# Patient Record
Sex: Female | Born: 1995
Health system: Southern US, Community
[De-identification: ages and names within clinical notes are randomized; demographics above are authoritative.]

## PROBLEM LIST (undated history)

## (undated) ENCOUNTER — Inpatient Hospital Stay (HOSPITAL_COMMUNITY): Payer: Self-pay

## (undated) DIAGNOSIS — I10 Essential (primary) hypertension: Secondary | ICD-10-CM

## (undated) DIAGNOSIS — F1290 Cannabis use, unspecified, uncomplicated: Secondary | ICD-10-CM

## (undated) DIAGNOSIS — T7840XA Allergy, unspecified, initial encounter: Secondary | ICD-10-CM

## (undated) DIAGNOSIS — N3020 Other chronic cystitis without hematuria: Secondary | ICD-10-CM

## (undated) DIAGNOSIS — Z309 Encounter for contraceptive management, unspecified: Secondary | ICD-10-CM

## (undated) DIAGNOSIS — L732 Hidradenitis suppurativa: Secondary | ICD-10-CM

## (undated) DIAGNOSIS — N289 Disorder of kidney and ureter, unspecified: Secondary | ICD-10-CM

## (undated) DIAGNOSIS — R87629 Unspecified abnormal cytological findings in specimens from vagina: Secondary | ICD-10-CM

## (undated) DIAGNOSIS — R768 Other specified abnormal immunological findings in serum: Principal | ICD-10-CM

## (undated) DIAGNOSIS — Z3491 Encounter for supervision of normal pregnancy, unspecified, first trimester: Secondary | ICD-10-CM

## (undated) DIAGNOSIS — R87619 Unspecified abnormal cytological findings in specimens from cervix uteri: Secondary | ICD-10-CM

## (undated) DIAGNOSIS — K219 Gastro-esophageal reflux disease without esophagitis: Secondary | ICD-10-CM

## (undated) DIAGNOSIS — R609 Edema, unspecified: Secondary | ICD-10-CM

## (undated) HISTORY — DX: Unspecified abnormal cytological findings in specimens from cervix uteri: R87.619

## (undated) HISTORY — DX: Other chronic cystitis without hematuria: N30.20

## (undated) HISTORY — DX: Other specified abnormal immunological findings in serum: R76.8

## (undated) HISTORY — DX: Encounter for supervision of normal pregnancy, unspecified, first trimester: Z34.91

## (undated) HISTORY — PX: TONSILLECTOMY: SUR1361

## (undated) HISTORY — DX: Encounter for contraceptive management, unspecified: Z30.9

## (undated) HISTORY — DX: Unspecified abnormal cytological findings in specimens from vagina: R87.629

## (undated) HISTORY — DX: Hidradenitis suppurativa: L73.2

---
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
## 2013-04-09 ENCOUNTER — Ambulatory Visit (INDEPENDENT_AMBULATORY_CARE_PROVIDER_SITE_OTHER): Payer: Medicaid Other | Admitting: Adult Health

## 2013-04-09 ENCOUNTER — Encounter: Payer: Self-pay | Admitting: Adult Health

## 2013-04-09 ENCOUNTER — Encounter (INDEPENDENT_AMBULATORY_CARE_PROVIDER_SITE_OTHER): Payer: Self-pay

## 2013-04-09 VITALS — BP 100/58 | Ht 68.0 in | Wt 277.0 lb

## 2013-04-09 DIAGNOSIS — Z3200 Encounter for pregnancy test, result unknown: Secondary | ICD-10-CM

## 2013-04-09 DIAGNOSIS — Z3201 Encounter for pregnancy test, result positive: Secondary | ICD-10-CM

## 2013-04-15 ENCOUNTER — Other Ambulatory Visit: Payer: Self-pay | Admitting: Obstetrics & Gynecology

## 2013-04-15 DIAGNOSIS — O3680X0 Pregnancy with inconclusive fetal viability, not applicable or unspecified: Secondary | ICD-10-CM

## 2013-04-17 ENCOUNTER — Other Ambulatory Visit: Payer: Self-pay | Admitting: Obstetrics & Gynecology

## 2013-04-17 ENCOUNTER — Ambulatory Visit (INDEPENDENT_AMBULATORY_CARE_PROVIDER_SITE_OTHER): Payer: Medicaid Other

## 2013-04-17 ENCOUNTER — Encounter: Payer: Self-pay | Admitting: Obstetrics & Gynecology

## 2013-04-17 DIAGNOSIS — O3680X0 Pregnancy with inconclusive fetal viability, not applicable or unspecified: Secondary | ICD-10-CM

## 2013-04-17 NOTE — Progress Notes (Signed)
U/S(6+6wks)-transvaginal u/s performed, single IUP with +FCA noted, FHR-123 bpm, cx long and closed, bilateral adnexa WNL, CRL c/w LMP dates

## 2013-04-27 ENCOUNTER — Encounter (HOSPITAL_COMMUNITY): Payer: Self-pay | Admitting: Emergency Medicine

## 2013-04-27 ENCOUNTER — Emergency Department (HOSPITAL_COMMUNITY)
Admission: EM | Admit: 2013-04-27 | Discharge: 2013-04-27 | Disposition: A | Discharge status: Home / Self Care | Payer: Medicaid Other | Attending: Emergency Medicine | Admitting: Emergency Medicine

## 2013-04-27 DIAGNOSIS — O9989 Other specified diseases and conditions complicating pregnancy, childbirth and the puerperium: Secondary | ICD-10-CM | POA: Insufficient documentation

## 2013-04-27 DIAGNOSIS — K920 Hematemesis: Secondary | ICD-10-CM

## 2013-04-27 DIAGNOSIS — Z3491 Encounter for supervision of normal pregnancy, unspecified, first trimester: Secondary | ICD-10-CM

## 2013-04-27 DIAGNOSIS — Z87448 Personal history of other diseases of urinary system: Secondary | ICD-10-CM | POA: Insufficient documentation

## 2013-04-27 DIAGNOSIS — Z87891 Personal history of nicotine dependence: Secondary | ICD-10-CM | POA: Insufficient documentation

## 2013-04-27 DIAGNOSIS — Z79899 Other long term (current) drug therapy: Secondary | ICD-10-CM | POA: Insufficient documentation

## 2013-04-27 DIAGNOSIS — R1110 Vomiting, unspecified: Secondary | ICD-10-CM

## 2013-04-27 DIAGNOSIS — O210 Mild hyperemesis gravidarum: Secondary | ICD-10-CM | POA: Insufficient documentation

## 2013-04-27 LAB — CBC WITH DIFFERENTIAL/PLATELET
Basophils Absolute: 0 10*3/uL (ref 0.0–0.1)
Basophils Relative: 0 % (ref 0–1)
Eosinophils Absolute: 0 10*3/uL (ref 0.0–0.7)
Eosinophils Relative: 0 % (ref 0–5)
HCT: 38.5 % (ref 36.0–46.0)
Hemoglobin: 13.3 g/dL (ref 12.0–15.0)
Lymphocytes Relative: 17 % (ref 12–46)
Lymphs Abs: 2.4 10*3/uL (ref 0.7–4.0)
MCH: 29.9 pg (ref 26.0–34.0)
MCHC: 34.5 g/dL (ref 30.0–36.0)
MCV: 86.5 fL (ref 78.0–100.0)
Monocytes Absolute: 0.6 10*3/uL (ref 0.1–1.0)
Monocytes Relative: 4 % (ref 3–12)
Neutro Abs: 11 10*3/uL — ABNORMAL HIGH (ref 1.7–7.7)
Neutrophils Relative %: 79 % — ABNORMAL HIGH (ref 43–77)
Platelets: 300 10*3/uL (ref 150–400)
RBC: 4.45 MIL/uL (ref 3.87–5.11)
RDW: 12.6 % (ref 11.5–15.5)
WBC: 14 10*3/uL — ABNORMAL HIGH (ref 4.0–10.5)

## 2013-04-27 LAB — BASIC METABOLIC PANEL
BUN: 9 mg/dL (ref 6–23)
CO2: 26 mEq/L (ref 19–32)
Calcium: 9.1 mg/dL (ref 8.4–10.5)
Chloride: 102 mEq/L (ref 96–112)
Creatinine, Ser: 0.71 mg/dL (ref 0.50–1.10)
GFR calc Af Amer: >90 mL/min (ref >90)
GFR calc non Af Amer: >90 mL/min (ref >90)
Glucose, Bld: 116 mg/dL — ABNORMAL HIGH (ref 70–99)
Potassium: 3.8 mEq/L (ref 3.5–5.1)
Sodium: 136 mEq/L (ref 135–145)

## 2013-04-27 LAB — URINALYSIS, ROUTINE W REFLEX MICROSCOPIC
Bilirubin Urine: NEGATIVE
Glucose, UA: NEGATIVE mg/dL
Hgb urine dipstick: NEGATIVE
Ketones, ur: NEGATIVE mg/dL
Nitrite: NEGATIVE
Protein, ur: NEGATIVE mg/dL
Specific Gravity, Urine: >1.03 — ABNORMAL HIGH (ref 1.005–1.030)
Urobilinogen, UA: 0.2 mg/dL (ref 0.0–1.0)
pH: 5.5 (ref 5.0–8.0)

## 2013-04-27 LAB — URINE MICROSCOPIC-ADD ON

## 2013-04-27 LAB — POCT PREGNANCY, URINE: Preg Test, Ur: POSITIVE — AB

## 2013-04-27 MED ORDER — ONDANSETRON HCL 4 MG PO TABS: 4.0000 mg | ORAL_TABLET | Freq: Four times a day (QID) | ORAL | Status: DC

## 2013-04-27 MED ORDER — ONDANSETRON 4 MG PO TBDP
4.0000 mg | ORAL_TABLET | Freq: Once | ORAL | Status: AC
  Administered 2013-04-27: 4 mg via ORAL
  Filled 2013-04-27: qty 1

## 2013-04-27 NOTE — ED Notes (Signed)
Patient c/o lower abd pain. Per patient vomited x3 with bright red blood. Per patient blood increasing each time she vomited. Denies any diarrhea, fevers, or urinary symptoms. Per patient BM this morning with no blood noted.

## 2013-04-27 NOTE — ED Notes (Signed)
Pt alert & oriented x4, stable gait. Patient given discharge instructions, paperwork & prescription(s). Patient  instructed to stop at the registration desk to finish any additional paperwork. Patient verbalized understanding. Pt left department w/ no further questions. 

## 2013-04-29 ENCOUNTER — Telehealth: Payer: Self-pay

## 2013-04-29 NOTE — Telephone Encounter (Signed)
Left message x 1. JSY 

## 2013-05-01 NOTE — Telephone Encounter (Signed)
Left message x 2. JSY 

## 2013-05-01 NOTE — ED Provider Notes (Signed)
CSN: [id number]     Arrival date & time 04/27/13  1239 History   First MD Initiated Contact with Patient 04/27/13 1306     Chief Complaint  Patient presents with  . Hematemesis  . Abdominal Pain   (Consider location/radiation/quality/duration/timing/severity/associated sxs/prior Treatment) HPI  17yf with n/v. Onset today. 3 episodes. Food she ate earlier with streaks of BRB. Denies abdominal, chest or neck pain. No fever or chills. Pregnant. No urinary complaints. No vaginal bleeding or discharge. No sick contacts. No diarrhea. No intervention prior to arrival. No appreciable exacerbating or relieving factors.    Past Medical History  Diagnosis Date  . Renal disorder   . Swelling    Past Surgical History  Procedure Laterality Date  . Tonsillectomy     Family History  Problem Relation Age of Onset  . Diabetes Paternal Grandfather   . Heart disease Paternal Grandfather   . Diabetes Paternal Grandmother   . Heart disease Paternal Grandmother   . Diabetes Maternal Grandmother   . Heart disease Maternal Grandmother   . Diabetes Maternal Grandfather   . Heart disease Maternal Grandfather    History  Substance Use Topics  . Smoking status: Former Smoker -- 0.50 packs/day for 2 years    Types: Cigarettes    Quit date: 04/04/2013  . Smokeless tobacco: Former Neurosurgeon    Types: Chew  . Alcohol Use: No   OB History   Grav Para Term Preterm Abortions TAB SAB Ect Mult Living   1              Review of Systems  All systems reviewed and negative, other than as noted in HPI.   Allergies  Aleve; Amoxicillin; and Tylenol  Home Medications   Current Outpatient Rx  Name  Route  Sig  Dispense  Refill  . calcium carbonate (TUMS - DOSED IN MG ELEMENTAL CALCIUM) 500 MG chewable tablet   Oral   Chew 1 tablet by mouth daily as needed for indigestion or heartburn.         . diphenhydrAMINE (BENADRYL) 25 mg capsule   Oral   Take 50 mg by mouth every 6 (six) hours as needed.  Allergies         . EPINEPHrine (EPI-PEN) 0.3 mg/0.3 mL DEVI   Intramuscular   Inject 0.3 mLs (0.3 mg total) into the muscle once.   1 Device   1   . flintstones complete (FLINTSTONES) 60 MG chewable tablet   Oral   Chew 1 tablet by mouth daily.         . ondansetron (ZOFRAN) 4 MG tablet   Oral   Take 1 tablet (4 mg total) by mouth every 6 (six) hours.   30 tablet   0    BP 123/56  Pulse 91  Temp(Src) 97.8 F (36.6 C) (Oral)  Resp 18  Ht 5\' 8"  (1.727 m)  Wt 277 lb 12.8 oz (126.009 kg)  BMI 42.25 kg/m2  SpO2 100%  LMP 02/28/2013 Physical Exam  Nursing note and vitals reviewed. Constitutional: She appears well-developed and well-nourished. No distress.  HENT:  Head: Normocephalic and atraumatic.  Eyes: Conjunctivae are normal. Right eye exhibits no discharge. Left eye exhibits no discharge.  Neck: Neck supple.  Cardiovascular: Normal rate, regular rhythm and normal heart sounds.  Exam reveals no gallop and no friction rub.   No murmur heard. Pulmonary/Chest: Effort normal and breath sounds normal. No respiratory distress.  Abdominal: Soft. She exhibits no distension. There is no  tenderness.  Genitourinary:  No cva tenderness  Musculoskeletal: She exhibits no edema and no tenderness.  Neurological: She is alert.  Skin: Skin is warm and dry.  Psychiatric: She has a normal mood and affect. Her behavior is normal. Thought content normal.    ED Course  Procedures (including critical care time) Labs Review Labs Reviewed  URINALYSIS, ROUTINE W REFLEX MICROSCOPIC - Abnormal; Notable for the following:    Color, Urine AMBER (*)    APPearance CLOUDY (*)    Specific Gravity, Urine >1.030 (*)    Leukocytes, UA TRACE (*)    All other components within normal limits  CBC WITH DIFFERENTIAL - Abnormal; Notable for the following:    WBC 14.0 (*)    Neutrophils Relative % 79 (*)    Neutro Abs 11.0 (*)    All other components within normal limits  BASIC METABOLIC PANEL -  Abnormal; Notable for the following:    Glucose, Bld 116 (*)    All other components within normal limits  URINE MICROSCOPIC-ADD ON - Abnormal; Notable for the following:    Squamous Epithelial / LPF FEW (*)    Bacteria, UA FEW (*)    All other components within normal limits  POCT PREGNANCY, URINE - Abnormal; Notable for the following:    Preg Test, Ur POSITIVE (*)    All other components within normal limits   Imaging Review No results found.  EKG Interpretation   None       MDM   1. Vomiting   2. Hematemesis   3. First trimester pregnancy    17 year old female with nausea and vomiting. Pregnant. She has benign abdominal exam. Minimal hematemesis. Suspect small mallory weiss tear.  Prenatal vitamins. Outpatient OB FU.     Raeford Razor, MD 05/06/13 [phone]

## 2013-05-03 ENCOUNTER — Ambulatory Visit (INDEPENDENT_AMBULATORY_CARE_PROVIDER_SITE_OTHER): Payer: Medicaid Other | Admitting: Adult Health

## 2013-05-03 ENCOUNTER — Encounter: Payer: Self-pay | Admitting: Adult Health

## 2013-05-03 VITALS — BP 140/60 | Wt 282.0 lb

## 2013-05-03 DIAGNOSIS — Z1389 Encounter for screening for other disorder: Secondary | ICD-10-CM

## 2013-05-03 DIAGNOSIS — Z3401 Encounter for supervision of normal first pregnancy, first trimester: Secondary | ICD-10-CM

## 2013-05-03 DIAGNOSIS — Z3491 Encounter for supervision of normal pregnancy, unspecified, first trimester: Secondary | ICD-10-CM

## 2013-05-03 DIAGNOSIS — Z331 Pregnant state, incidental: Secondary | ICD-10-CM

## 2013-05-03 DIAGNOSIS — Z3400 Encounter for supervision of normal first pregnancy, unspecified trimester: Secondary | ICD-10-CM | POA: Insufficient documentation

## 2013-05-03 DIAGNOSIS — E669 Obesity, unspecified: Secondary | ICD-10-CM

## 2013-05-03 HISTORY — DX: Encounter for supervision of normal pregnancy, unspecified, first trimester: Z34.91

## 2013-05-03 LAB — POCT URINALYSIS DIPSTICK
Glucose, UA: NEGATIVE
Ketones, UA: NEGATIVE
Nitrite, UA: NEGATIVE

## 2013-05-03 LAB — CBC
Hemoglobin: 13.8 g/dL (ref 12.0–15.0)
MCH: 29.4 pg (ref 26.0–34.0)
Platelets: 313 10*3/uL (ref 150–400)
RBC: 4.7 MIL/uL (ref 3.87–5.11)
WBC: 13.2 10*3/uL — ABNORMAL HIGH (ref 4.0–10.5)

## 2013-05-03 LAB — HIV ANTIBODY (ROUTINE TESTING W REFLEX): HIV: NONREACTIVE

## 2013-05-03 LAB — RPR

## 2013-05-03 NOTE — Patient Instructions (Signed)

## 2013-05-03 NOTE — Progress Notes (Signed)
  Subjective:    Suzanne Short is a 17 y.o. G1P0 Caucasian female at [redacted]w[redacted]d by LMP being seen today for her first obstetrical visit.  Her obstetrical history is significant for obesity.  Pregnancy history fully reviewed.   Patient reports nausea. Denies vb, cramping, uti s/s, abnormal/malodorous vag d/c, or vulvovaginal itching/irritation.  Filed Vitals:   05/03/13 1050  BP: 140/60  Weight: 282 lb (127.914 kg)    HISTORY: OB History  Gravida Para Term Preterm AB SAB TAB Ectopic Multiple Living  1             # Outcome Date GA Lbr Len/2nd Weight Sex Delivery Anes PTL Lv  1 CUR              Past Medical History  Diagnosis Date  . Renal disorder   . Swelling   . Bladder infection, chronic   . Supervision of normal pregnancy in first trimester 05/03/2013   Past Surgical History  Procedure Laterality Date  . Tonsillectomy     Family History  Problem Relation Age of Onset  . Diabetes Paternal Grandfather   . Heart disease Paternal Grandfather   . Diabetes Paternal Grandmother   . Heart disease Paternal Grandmother   . Diabetes Maternal Grandmother   . Heart disease Maternal Grandmother   . Diabetes Maternal Grandfather   . Heart disease Maternal Grandfather   . Heart disease Mother      Exam    Pelvic Exam:    Perineum: defereed   Vulva: deferred   Vagina:  deferred   Uterus 9 weeks     Cervix: deferred   Adnexa: Not palpable   Urinary: deferred    System:     Skin: normal coloration and turgor, no rashes    Neurologic: oriented, normal mood   Extremities: normal strength, tone, and muscle mass   HEENT PERRLA,thyroid normal   Mouth/Teeth mucous membranes moist   Cardiovascular: regular rate and rhythm   Respiratory:  appears well, vitals normal, no respiratory distress, acyanotic, normal RR   Abdomen: soft, non-tender     FHR: 178   Assessment:    Pregnancy: G1P0 Patient Active Problem List   Diagnosis Date Noted  . Supervision of normal pregnancy  in first trimester 05/03/2013      [redacted]w[redacted]d G1P0 New OB visit    Plan:     Initial labs drawn Continue prenatal vitamins Problem list reviewed and updated Reviewed n/v relief measures and warning s/s to report Reviewed recommended weight gain based on pre-gravid BMI Encouraged well-balanced diet Genetic Screening discussed Integrated Screen: requested Cystic fibrosis screening discussed requested Ultrasound discussed; fetal survey: requested Follow up in 3 weeks for IT/NT and see me Get flu shot at clinic  GRIFFIN,JENNIFER 05/03/2013 11:20 AM

## 2013-05-04 LAB — URINALYSIS, ROUTINE W REFLEX MICROSCOPIC
Bilirubin Urine: NEGATIVE
Glucose, UA: NEGATIVE mg/dL
Hgb urine dipstick: NEGATIVE
Ketones, ur: NEGATIVE mg/dL
Protein, ur: NEGATIVE mg/dL
pH: 7.5 (ref 5.0–8.0)

## 2013-05-04 LAB — URINALYSIS, MICROSCOPIC ONLY
Bacteria, UA: NONE SEEN
Casts: NONE SEEN
Crystals: NONE SEEN
Squamous Epithelial / LPF: NONE SEEN

## 2013-05-04 LAB — ABO AND RH: Rh Type: POSITIVE

## 2013-05-04 LAB — GC/CHLAMYDIA PROBE AMP
CT Probe RNA: NEGATIVE
GC Probe RNA: NEGATIVE

## 2013-05-04 LAB — DRUG SCREEN, URINE, NO CONFIRMATION
Barbiturate Quant, Ur: NEGATIVE
Creatinine,U: 114.8 mg/dL
Marijuana Metabolite: POSITIVE — AB
Methadone: NEGATIVE
Opiate Screen, Urine: NEGATIVE
Phencyclidine (PCP): NEGATIVE
Propoxyphene: NEGATIVE

## 2013-05-05 LAB — URINE CULTURE

## 2013-05-13 NOTE — Telephone Encounter (Signed)
Left message x 3. Encounter closed. JSY 

## 2013-05-16 NOTE — L&D Delivery Note (Signed)
`````  Attestation of Attending Supervision of Advanced Practitioner: Evaluation and management procedures were performed by the PA/NP/CNM/OB Fellow under my supervision/collaboration. Chart reviewed and agree with management and plan.  Jeorgia Helming V 12/14/2013 3:33 PM    

## 2013-05-16 NOTE — L&D Delivery Note (Signed)
Delivery Note  After about 3 hours of pushing, at  a viable female was delivered via  (Presentation:  LOA ).  APGAR:5/8  ; weight 9# 12 oz. The shoulders were not forthcoming, so the posterior (left) axilla was grasped with my index finger, and the baby was rotated clockwise into the oblique diameter.  At this point, the (now) anterior shoulder was released, and the baby delivered.  At no time was any traction placed on the baby's head.This maneuver was performed by CNM, and once the shoulder was released, the delivery was handed back to Dr. Andria MeuseStevens.  40 units of pitocin diluted in 1000cc LR was infused rapidly IV.  The placenta separated spontaneously and delivered via CCT and maternal pushing effort.  It was inspected and appears to be intact with a 3 VC. Cord gas drawn but clotted.  There were the following complications: mild shoulder dystocia  Anesthesia: Epidural and local Episiotomy: none Lacerations: 2nd degree perineal Suture Repair: 2.0 vicryl Est. Blood Loss (mL): 300  Mom to postpartum.  Baby to Couplet care / Skin to Skin.  Delivery and repair performed by Dr. Andria MeuseStevens under my supervision.

## 2013-05-22 ENCOUNTER — Other Ambulatory Visit: Payer: Self-pay | Admitting: Obstetrics & Gynecology

## 2013-05-22 DIAGNOSIS — Z36 Encounter for antenatal screening of mother: Secondary | ICD-10-CM

## 2013-05-27 ENCOUNTER — Ambulatory Visit (INDEPENDENT_AMBULATORY_CARE_PROVIDER_SITE_OTHER): Payer: Medicaid Other | Admitting: Obstetrics & Gynecology

## 2013-05-27 ENCOUNTER — Ambulatory Visit (INDEPENDENT_AMBULATORY_CARE_PROVIDER_SITE_OTHER): Payer: Medicaid Other

## 2013-05-27 ENCOUNTER — Other Ambulatory Visit: Payer: Self-pay | Admitting: Obstetrics & Gynecology

## 2013-05-27 ENCOUNTER — Encounter (INDEPENDENT_AMBULATORY_CARE_PROVIDER_SITE_OTHER): Payer: Self-pay

## 2013-05-27 ENCOUNTER — Encounter: Payer: Self-pay | Admitting: Obstetrics & Gynecology

## 2013-05-27 VITALS — BP 120/60 | Wt 280.0 lb

## 2013-05-27 DIAGNOSIS — E669 Obesity, unspecified: Secondary | ICD-10-CM

## 2013-05-27 DIAGNOSIS — O99210 Obesity complicating pregnancy, unspecified trimester: Secondary | ICD-10-CM

## 2013-05-27 DIAGNOSIS — Z331 Pregnant state, incidental: Secondary | ICD-10-CM

## 2013-05-27 DIAGNOSIS — Z36 Encounter for antenatal screening of mother: Secondary | ICD-10-CM

## 2013-05-27 DIAGNOSIS — Z1389 Encounter for screening for other disorder: Secondary | ICD-10-CM

## 2013-05-27 LAB — POCT URINALYSIS DIPSTICK
GLUCOSE UA: NEGATIVE
KETONES UA: NEGATIVE
Leukocytes, UA: NEGATIVE
Nitrite, UA: NEGATIVE
RBC UA: NEGATIVE

## 2013-05-27 NOTE — Progress Notes (Signed)
No bleeding No NV Feels pretty good

## 2013-05-27 NOTE — Progress Notes (Signed)
U/S(12+4wks)-single IUP with +FCA noted, FHR-168 bpm, CRL c/w dates, cx appears long and closed, bilateral adnexa appears wnl, anterior Gr 0 placenta noted, NT-1.1652mm, NB-present

## 2013-05-30 LAB — MATERNAL SCREEN, INTEGRATED #1

## 2013-06-03 ENCOUNTER — Other Ambulatory Visit: Payer: Self-pay | Admitting: *Deleted

## 2013-06-03 MED ORDER — ONDANSETRON HCL 4 MG PO TABS: 4.0000 mg | ORAL_TABLET | Freq: Four times a day (QID) | ORAL | Status: DC

## 2013-06-06 ENCOUNTER — Emergency Department (HOSPITAL_COMMUNITY)
Admission: EM | Admit: 2013-06-06 | Discharge: 2013-06-06 | Disposition: A | Discharge status: Home / Self Care | Payer: Medicaid Other | Attending: Emergency Medicine | Admitting: Emergency Medicine

## 2013-06-06 ENCOUNTER — Encounter (HOSPITAL_COMMUNITY): Payer: Self-pay | Admitting: Emergency Medicine

## 2013-06-06 DIAGNOSIS — Z79899 Other long term (current) drug therapy: Secondary | ICD-10-CM | POA: Insufficient documentation

## 2013-06-06 DIAGNOSIS — N390 Urinary tract infection, site not specified: Secondary | ICD-10-CM | POA: Insufficient documentation

## 2013-06-06 DIAGNOSIS — Z880 Allergy status to penicillin: Secondary | ICD-10-CM | POA: Insufficient documentation

## 2013-06-06 DIAGNOSIS — R251 Tremor, unspecified: Secondary | ICD-10-CM

## 2013-06-06 DIAGNOSIS — R259 Unspecified abnormal involuntary movements: Secondary | ICD-10-CM | POA: Insufficient documentation

## 2013-06-06 DIAGNOSIS — O9989 Other specified diseases and conditions complicating pregnancy, childbirth and the puerperium: Secondary | ICD-10-CM | POA: Insufficient documentation

## 2013-06-06 DIAGNOSIS — Z87891 Personal history of nicotine dependence: Secondary | ICD-10-CM | POA: Insufficient documentation

## 2013-06-06 DIAGNOSIS — O2390 Unspecified genitourinary tract infection in pregnancy, unspecified trimester: Secondary | ICD-10-CM | POA: Insufficient documentation

## 2013-06-06 LAB — URINALYSIS W MICROSCOPIC + REFLEX CULTURE
Bilirubin Urine: NEGATIVE
GLUCOSE, UA: NEGATIVE mg/dL
Hgb urine dipstick: NEGATIVE
KETONES UR: NEGATIVE mg/dL
Nitrite: NEGATIVE
PROTEIN: NEGATIVE mg/dL
Specific Gravity, Urine: 1.025 (ref 1.005–1.030)
UROBILINOGEN UA: 0.2 mg/dL (ref 0.0–1.0)
pH: 6 (ref 5.0–8.0)

## 2013-06-06 LAB — POCT I-STAT, CHEM 8
Calcium, Ion: 1.17 mmol/L (ref 1.12–1.23)
Chloride: 101 mEq/L (ref 96–112)
Creatinine, Ser: 0.6 mg/dL (ref 0.50–1.10)
Glucose, Bld: 94 mg/dL (ref 70–99)
HCT: 40 % (ref 36.0–46.0)
Hemoglobin: 13.6 g/dL (ref 12.0–15.0)
POTASSIUM: 3.6 meq/L — AB (ref 3.7–5.3)
SODIUM: 135 meq/L — AB (ref 137–147)
TCO2: 20 mmol/L (ref 0–100)

## 2013-06-06 LAB — GLUCOSE, CAPILLARY: GLUCOSE-CAPILLARY: 96 mg/dL (ref 70–99)

## 2013-06-06 MED ORDER — NITROFURANTOIN MONOHYD MACRO 100 MG PO CAPS: 100.0000 mg | ORAL_CAPSULE | Freq: Two times a day (BID) | ORAL | Status: DC

## 2013-06-06 NOTE — ED Notes (Signed)
Pt given detalied discharge instructions, 1 script given, verbalized understanding of all

## 2013-06-06 NOTE — ED Notes (Signed)
Woke up, sit on side of the bed, felt like her sugar dropped, blurred vision, shaking, diaphoretic, pt drank air juice and ate crackers and them vomiting. She did get some apple juice to stay down. Father brought to ED. CBG 96 at this time

## 2013-06-06 NOTE — ED Notes (Signed)
Pt is [redacted] weeks pregnant, states she felt fine when she went to bed.

## 2013-06-06 NOTE — ED Provider Notes (Signed)
CSN: [id number]     Arrival date & time 06/06/13  [id number] History   First MD Initiated Contact with Patient 06/06/13 0700     Chief Complaint  Patient presents with  . Shaking    HPI Pt was seen at 0710. Per pt, c/o sudden onset and resolution of one episode of "feeling shaky all over" that began PTA. Pt states she woke up from sleep feeling "shaky" with hot/cold chills. States she was able to walk to her kitchen, ate crackers, and had one episode of N/V. Then drank juice without N/V. States her symptoms fully resolved at that time and remain improved. Denies any other complaints. Denies hx of DM. Hx G1P0, LMP 02/28/13, with EGA [redacted] weeks. Denies dysuria/hematuria, no vaginal bleeding/discharge, no abd/pelvic pain, no diarrhea, no black or blood in stools or emesis, no fevers, no rash, no CP/palpitations, no SOB/cough, no sore throat.    OB/GYN: Dr. Despina HiddenEure Past Medical History  Diagnosis Date  . Renal disorder   . Swelling   . Bladder infection, chronic   . Supervision of normal pregnancy in first trimester 05/03/2013   Past Surgical History  Procedure Laterality Date  . Tonsillectomy     Family History  Problem Relation Age of Onset  . Diabetes Paternal Grandfather   . Heart disease Paternal Grandfather   . Diabetes Paternal Grandmother   . Heart disease Paternal Grandmother   . Diabetes Maternal Grandmother   . Heart disease Maternal Grandmother   . Diabetes Maternal Grandfather   . Heart disease Maternal Grandfather   . Heart disease Mother    History  Substance Use Topics  . Smoking status: Former Smoker -- 0.50 packs/day for 2 years    Types: Cigarettes    Quit date: 04/04/2013  . Smokeless tobacco: Former NeurosurgeonUser    Types: Chew  . Alcohol Use: No   OB History   Grav Para Term Preterm Abortions TAB SAB Ect Mult Living   1              Review of Systems ROS: Statement: All systems negative except as marked or noted in the HPI; Constitutional: Negative for fever and  +hot/cold chills, shaky. ; ; Eyes: Negative for eye pain, redness and discharge. ; ; ENMT: Negative for ear pain, hoarseness, nasal congestion, sinus pressure and sore throat. ; ; Cardiovascular: Negative for chest pain, palpitations, diaphoresis, dyspnea and peripheral edema. ; ; Respiratory: Negative for cough, wheezing and stridor. ; ; Gastrointestinal: +N/V x1. Negative for diarrhea, abdominal pain, blood in stool, hematemesis, jaundice and rectal bleeding. . ; ; Genitourinary: Negative for dysuria, flank pain and hematuria. ; ; Musculoskeletal: Negative for back pain and neck pain. Negative for swelling and trauma.; ; Skin: Negative for pruritus, rash, abrasions, blisters, bruising and skin lesion.; ; Neuro: Negative for headache, lightheadedness and neck stiffness. Negative for weakness, altered level of consciousness , altered mental status, extremity weakness, paresthesias, involuntary movement, seizure and syncope.      Allergies  Aleve; Amoxicillin; and Tylenol  Home Medications   Current Outpatient Rx  Name  Route  Sig  Dispense  Refill  . calcium carbonate (TUMS - DOSED IN MG ELEMENTAL CALCIUM) 500 MG chewable tablet   Oral   Chew 1 tablet by mouth daily as needed for indigestion or heartburn.         . flintstones complete (FLINTSTONES) 60 MG chewable tablet   Oral   Chew 1 tablet by mouth daily.         .Marland Kitchen  ondansetron (ZOFRAN) 4 MG tablet   Oral   Take 1 tablet (4 mg total) by mouth every 6 (six) hours.   30 tablet   4   . diphenhydrAMINE (BENADRYL) 25 mg capsule   Oral   Take 50 mg by mouth every 6 (six) hours as needed. Allergies         . EPINEPHrine (EPI-PEN) 0.3 mg/0.3 mL DEVI   Intramuscular   Inject 0.3 mLs (0.3 mg total) into the muscle once.   1 Device   1    BP 123/73  Pulse 107  Temp(Src) 98.6 F (37 C) (Oral)  Resp 20  Ht 5\' 10"  (1.778 m)  Wt 279 lb (126.554 kg)  BMI 40.03 kg/m2  SpO2 100%  LMP 02/28/2013 Physical Exam 0715: Physical  examination:  Nursing notes reviewed; Vital signs and O2 SAT reviewed;  Constitutional: Well developed, Well nourished, Well hydrated, In no acute distress; Head:  Normocephalic, atraumatic; Eyes: EOMI, PERRL, No scleral icterus; ENMT: TM's clear bilat. +edemetous nasal turbinates bilat with clear rhinorrhea. Mouth and pharynx without lesions. No tonsillar exudates. No intra-oral edema. No submandibular or sublingual edema. No hoarse voice, no drooling, no stridor. No pain with manipulation of larynx. Mouth and pharynx normal, Mucous membranes moist; Neck: Supple, Full range of motion, No lymphadenopathy; Cardiovascular: Regular rate and rhythm, No gallop; Respiratory: Breath sounds clear & equal bilaterally, No wheezes.  Speaking full sentences with ease, Normal respiratory effort/excursion; Chest: Nontender, Movement normal; Abdomen: Soft, Nontender, Nondistended, Normal bowel sounds; Genitourinary: No CVA tenderness; Extremities: Pulses normal, No tenderness, No edema, No calf edema or asymmetry.; Neuro: AA&Ox3, Major CN grossly intact. No facial droop. Speech clear. No gross focal motor or sensory deficits in extremities. Climbs on and off stretcher easily by herself. Gait steady.; Skin: Color normal, Warm, Dry.   ED Course  Procedures    EKG Interpretation   None       MDM  MDM Reviewed: previous chart, nursing note and vitals Reviewed previous: labs Interpretation: labs   Results for orders placed during the hospital encounter of 06/06/13  GLUCOSE, CAPILLARY      Result Value Range   Glucose-Capillary 96  70 - 99 mg/dL   Comment 1 Documented in Chart     Comment 2 Notify RN    URINALYSIS W MICROSCOPIC + REFLEX CULTURE      Result Value Range   Color, Urine YELLOW  YELLOW   APPearance CLEAR  CLEAR   Specific Gravity, Urine 1.025  1.005 - 1.030   pH 6.0  5.0 - 8.0   Glucose, UA NEGATIVE  NEGATIVE mg/dL   Hgb urine dipstick NEGATIVE  NEGATIVE   Bilirubin Urine NEGATIVE  NEGATIVE    Ketones, ur NEGATIVE  NEGATIVE mg/dL   Protein, ur NEGATIVE  NEGATIVE mg/dL   Urobilinogen, UA 0.2  0.0 - 1.0 mg/dL   Nitrite NEGATIVE  NEGATIVE   Leukocytes, UA SMALL (*) NEGATIVE   WBC, UA TOO NUMEROUS TO COUNT  <3 WBC/hpf   Bacteria, UA MANY (*) RARE   Squamous Epithelial / LPF MANY (*) RARE  POCT I-STAT, CHEM 8      Result Value Range   Sodium 135 (*) 137 - 147 mEq/L   Potassium 3.6 (*) 3.7 - 5.3 mEq/L   Chloride 101  96 - 112 mEq/L   BUN <3 (*) 6 - 23 mg/dL   Creatinine, Ser 1.61  0.50 - 1.10 mg/dL   Glucose, Bld 94  70 - 99 mg/dL  Calcium, Ion 1.17  1.12 - 1.23 mmol/L   TCO2 20  0 - 100 mmol/L   Hemoglobin 13.6  12.0 - 15.0 g/dL   HCT 45.4  09.8 - 11.9 %   US Fetal Nuchal Translucency Measurement 05/27/2013   NUCHAL TRANSLUCENCY FOR INTEGRATED TESTING   Suzanne Short is in the office for nuchal translucency sonogram as part of  an integrated screen.  She is a 18 y.o. year old G1P0 with Estimated Date of Delivery: 12/05/13 by  LMP now at  [redacted]w[redacted]d weeks gestation. Thus far the pregnancy has been  complicated by +UDS.  GESTATION: SINGLETON  FETAL ACTIVITY:          Heart rate         168 bpm          The fetus is active.  AMNIOTIC FLUID: The amniotic fluid volume is  normal,   PLACENTA LOCALIZATION:  anterior GRADE 0  CERVIX: Appears long and closed   ADNEXA: The ovaries are normal.      GESTATIONAL AGE AND  BIOMETRICS:  Gestational criteria: Estimated Date of Delivery: 12/05/13 by LMP now at  [redacted]w[redacted]d  Previous Scans:1  GESTATIONAL SAC            mm          weeks  CROWN RUMP LENGTH           67.2 mm         13+0 weeks  NUCHAL TRANSLUCENCY           1.52 mm         normal                                                                           AVERAGE EGA(BY THIS SCAN):   13+0 weeks   The fetal nasal bone is identified.    TECHNICIAN COMMENTS:  U/S(12+4wks)-single IUP with +FCA noted, FHR-168 bpm, CRL c/w dates, cx  appears long and closed, bilateral adnexa appears wnl, anterior Gr 0  placenta  noted, NT-1.51mm, NB-present   The patient will have the first blood draw of her integrated screening  today and the second draw in approximately 4 weeks.  Chari Manning 05/27/2013 10:51 AM  Clinical Impression and recommendations:  I have reviewed the sonogram results above, combined with the patient's  current clinical course, below are my impressions and any appropriate  recommendations for management based on the sonographic findings.  1.  G1P0 Estimated Date of Delivery: 12/05/13 by  LMP and confirmed by  today's sonographic dating 2.  Normal fetal sonographic findings, specifically normal nuchal  translucency and positive fetal nasal bone 3.  Normal general sonographic findings  Recommend routine prenatal care based on this sonogram or as clinically  indicated  EURE,LUTHER H 05/27/2013 11:33 AM                                                              0815:  Pt has tol PO well while in the  ED without N/V.  No stooling while in the ED.  Abd remains benign, VSS. Has been ambulatory around the ED with steady upright gait, resps easy, NAD. Continues to feel better and wants to go home now. Will tx for UTI while UC pending. Dx and testing d/w pt.  Questions answered.  Verb understanding, agreeable to d/c home with outpt f/u.      Laray Anger, DO 06/09/13 1129

## 2013-06-06 NOTE — Discharge Instructions (Signed)
°Emergency Department Resource Guide °1) Find a Doctor and Pay Out of Pocket °Although you won't have to find out who is covered by your insurance plan, it is a good idea to ask around and get recommendations. You will then need to call the office and see if the doctor you have chosen will accept you as a new patient and what types of options they offer for patients who are self-pay. Some doctors offer discounts or will set up payment plans for their patients who do not have insurance, but you will need to ask so you aren't surprised when you get to your appointment. ° °2) Contact Your Local Health Department °Not all health departments have doctors that can see patients for sick visits, but many do, so it is worth a call to see if yours does. If you don't know where your local health department is, you can check in your phone book. The CDC also has a tool to help you locate your state's health department, and many state websites also have listings of all of their local health departments. ° °3) Find a Walk-in Clinic °If your illness is not likely to be very severe or complicated, you may want to try a walk in clinic. These are popping up all over the country in pharmacies, drugstores, and shopping centers. They're usually staffed by nurse practitioners or physician assistants that have been trained to treat common illnesses and complaints. They're usually fairly quick and inexpensive. However, if you have serious medical issues or chronic medical problems, these are probably not your best option. ° °No Primary Care Doctor: °- Call Health Connect at  832-8000 - they can help you locate a primary care doctor that  accepts your insurance, provides certain services, etc. °- Physician Referral Service- 1-800-533-3463 ° °Chronic Pain Problems: °Organization         Address  Phone   Notes  °[facility]  ([phone]) [phone] Patients need to be referred by their primary care doctor.  ° °Medication  Assistance: °Organization         Address  Phone   Notes  °[place] [facility] [address] °[place], [place] [address] (336) 641-8030 --Must be a resident of [place] °-- Must have NO insurance coverage whatsoever (no Medicaid/ Medicare, etc.) °-- The pt. MUST have a primary care doctor that directs their care regularly and follows them in the community °  °[facility]  (866) 331-1348   °[employer]  (888) 892-1162   ° °Agencies that provide inexpensive medical care: °Organization         Address  Phone   Notes  °[facility]  (336) 832-8035   °[facility]    (336) 832-7272   °[facility] [address] °[place], [place] [address] (336) 832-4777   °[facility] [address], °[place] (336) 271-4999   °[facility]    (336) 373-0678   °[facility]    (336) 272-1050   °[facility] ° [address], [place] Phone:  (336) 832-4444, Fax:  (336) 832-4440 Hours of Operation:  9 am - 6 pm, M-F.  Also accepts Medicaid/Medicare and self-pay.  °[facility] ° [address], [place] Phone: (336) [phone], Fax: (336) [phone]. Hours of Operation:  8:30 am - 5:30 pm, M-F.  Also accepts Medicaid and self-pay.  °[facility] [place] [address], [place] Phone: (336) 878-6027   °[facility] [address], [place], [place] (336)723-1848, Ext. 123 Mondays & Thursdays: 7-9 AM.  First 15 patients are seen on a first come, first serve basis. °  ° °Medicaid-accepting [place] Providers: ° °Organization         Address  Phone   Notes  °[facility] [address], [place] (336) 641-2100 Also accepts self-pay patients.  °[facility] [address], [place] ° (336) 856-9996   °[facility] [address], [place]  (336) 288-8857   °[facility] [address] [place] [address], [place] (336) 299-7000   °Veita Bland [address], [place]  ° (336) 373-1557 Only accepts [place] Access Medicaid patients after they have their name applied to their card.  ° °Self-Pay (no insurance) in [place]: ° °Organization         Address  Phone   Notes  °Sickle Cell Patients, [facility] [address], [place] (336) 832-1970   °[facility] [address], [place] (336) 832-4400   °[facility] ° [address], [place] (336) 992-4800   °[facility]/Dr. Osei-Bonsu ° [address] [place] [address], [place] or [address], [place] (336) [phone] Phone number for both [place] and [place] locations is the same.  °[facility] [address], [place] (336) 299-0000   °[facility] [address] [place] [address], [place] or [address] (336) 852-7530 °(336) 878-2260   °[facility] [address], [place] (336) 350-1642, phone; (336) 294-5005, fax Sees patients 1st and 3rd Saturday of every month.  Must not qualify for public or private insurance (i.e. Medicaid, Medicare, [place] Health Choice, Veterans' Benefits) • Household income should be no more than 200% of the poverty level •The clinic cannot treat you if you are pregnant or think you are pregnant • Sexually transmitted diseases are not treated at the clinic.  ° ° °Dental Care: °Organization         Address  Phone  Notes  °[place] [facility] [address], [place] (336) 641-6152 Accepts children up to age 21 who are enrolled in Medicaid or [place] Health Choice; pregnant women with a Medicaid card; and children who have applied for Medicaid or [place] Health Choice, but were declined, whose parents can pay a reduced fee at time of service.  °[place]  [facility] [place]  [address], [place] (336) 641-7733 Accepts children up to age 21 who are enrolled in Medicaid or [place] Health Choice; pregnant women with a Medicaid card; and children who have applied for Medicaid or [place] Health Choice, but were declined, whose parents can pay a reduced fee at time of service.  °[facility] ° [address], [place] (336) 641-4533 Patients are seen by appointment only. Walk-ins are not accepted. [facility] will see patients 18 years of age and older. °Monday - Tuesday (8am-5pm) °Most Wednesdays (8:30-5pm) °$30 per visit, cash only  °[facility] ° [address], [place] (336) 641-4533 Patients are seen by appointment only. Walk-ins are not accepted. [facility] will see patients 18 years of age and older. °One   Wednesday Evening (Monthly: Volunteer Based).  $30 per visit, cash only  °[facility]  (919) 537-3737 for adults; Children under age 4, call Graduate Pediatric Dentistry at ([phone]) [phone]. Children aged 4-14, please call (919) [phone] to request a pediatric application. ° Dental services are provided in all areas of dental care including fillings, crowns and bridges, complete and partial dentures, implants, gum treatment, root canals, and extractions. Preventive care is also provided. Treatment is provided to both adults and children. °Patients are selected via a lottery and there is often a waiting list. °  °[facility] [address], °[place] ° (336) 763-8833 [email or web address] °  °[facility] [address], [place], [place] (336)723-1848, Ext. 123 Second and Fourth Thursday of each month, opens at 6:30 AM; Clinic ends at 9 AM.  Patients are seen on a first-come first-served basis, and a limited number are seen during each clinic.  ° °[facility] ° [address], [place], [place] (336) 723-7904    Eligibility Requirements °You must have lived in [place], [place], or [place] counties for at least the last three months. °  You cannot be eligible for state or federal sponsored healthcare insurance, including Veterans Administration, Medicaid, or Medicare. °  You generally cannot be eligible for healthcare insurance through your employer.  °  How to apply: °Eligibility screenings are held every Tuesday and Wednesday afternoon from 1:00 pm until 4:00 pm. You do not need an appointment for the interview!  °[facility] [address], [place], [place] 336-631-2330   °[facility]  336-342-8273   °[facility]  336-703-3100   °[facility]  336-570-6415   ° °Behavioral Health Resources in the Community: °Intensive Outpatient Programs °Organization         Address  Phone  Notes  °[facility] [address], [place], [place] 336-878-6098   °[facility] [address], [place], [place] 336-832-9800   °[facility] [address], [place], [place] ° 336-882-2125   °[facility] [address],  °[place], [place] 1-800-853-5163 or 336-641-4981   °Substance Abuse Resources °Organization         Address  Phone  Notes  °[facility]  336-882-2125   °[facility]  336-784-9470   °[facility]  336-285-9073   °[facility]  336-845-3988   °Residential & Outpatient Substance Abuse Program  1-800-659-3381   °Psychological Services °Organization         Address  Phone  Notes  °[facility]  336- 832-9600   °[facility]  336- 378-7881   °[facility] [address], [place] 1-800-853-5163 or 336-641-4981   ° °Mobile Crisis Teams °Organization         Address  Phone  Notes  °[facility], Mobile Crisis Care Unit  1-877-626-1772   °Assertive °[facility] ° [address]  [place], [place] 336-834-9664   °Sharon DeEsch [address] °[place] 336-554-5454   ° °Self-Help/Support Groups °Organization         Address  Phone             Notes  °[facility]oc[facility] of  - variety of support groups  336- 373-1402 Call for more information  °Narcotics Anonymous (NA), [facility] [address], °[place]  2 meetings at this location  ° °  Residential Treatment Programs Organization         Address  Phone  Notes  [facility] [address],    [place]  [phone])[phone]   [facility]  [address], [place] [phone], [place], [place] [phone](308)242-8333   [facility] [address], [place] [phone]) [phone] Admissions: 8am-3pm M-F  [facility] [address]    [place], [place] [phone]   [facility] [address], [place], [place] [phone]   [facility] [address]  [place], [place] [phone]   [facility] Intensive Outpatient [address], [place], [place] [phone]   [facility] ([facility] [address]  [place], [place] [phone] or [phone]   [facility] ([facility] [address] [place], [place] [phone] Accepts Medicaid  [facility] [address]  [place] [phone] Substance Abuse/Addiction Treatment   [facility] Organization         Address  Phone  Notes  [facility]  (914)852-4965(888) (684) [phone]   Angie FavaJulie Brannon, PhD [address], [address] [place], [place]   [phone](336) [phone] or [phone](336) [phone]   [facility]   [address] [place], [place] [phone](336) [phone]   [facility] [address], [place], [place] [phone]) [phone] Insurance/Medicaid/sponsorship through [facility] [address]                                    [place], [place] [phone](336) [phone] Therapy/tele-psych/case    [facility] [address]   [place], [place] [phone]) [phone]    Dr. Lolly MustacheArfeen  [phone]) [phone]   [facility] [facility]  [employer] [facility] 1) [address], [place] 2) [address], [place] 3)  [address], [place] [phone](336) [phone](336) [phone]  ([phone](336) [phone]   [facility] Child Abuse Hotline (253) 666-7238(336) 812-417-0198 or [phone](336) [phone] (After Hours)       Take the prescription as directed. Eat at regular intervals throughout the day. Call your regular OB/GYN doctor today to schedule a follow up appointment within the next 2 days.  Return to the Emergency Department immediately sooner if worsening.

## 2013-06-06 NOTE — ED Notes (Signed)
Pt also complaining of chest pressure and congestion. Household has had the flu

## 2013-06-07 ENCOUNTER — Ambulatory Visit (INDEPENDENT_AMBULATORY_CARE_PROVIDER_SITE_OTHER): Payer: Medicaid Other | Admitting: Adult Health

## 2013-06-07 ENCOUNTER — Encounter: Payer: Self-pay | Admitting: Adult Health

## 2013-06-07 ENCOUNTER — Telehealth: Payer: Self-pay | Admitting: Obstetrics and Gynecology

## 2013-06-07 VITALS — BP 114/80 | Temp 97.8°F | Wt 283.0 lb

## 2013-06-07 DIAGNOSIS — O210 Mild hyperemesis gravidarum: Secondary | ICD-10-CM

## 2013-06-07 DIAGNOSIS — R21 Rash and other nonspecific skin eruption: Secondary | ICD-10-CM

## 2013-06-07 DIAGNOSIS — Z331 Pregnant state, incidental: Secondary | ICD-10-CM

## 2013-06-07 DIAGNOSIS — O2390 Unspecified genitourinary tract infection in pregnancy, unspecified trimester: Secondary | ICD-10-CM

## 2013-06-07 DIAGNOSIS — Z1389 Encounter for screening for other disorder: Secondary | ICD-10-CM

## 2013-06-07 LAB — POCT URINALYSIS DIPSTICK
Blood, UA: NEGATIVE
Glucose, UA: NEGATIVE
Ketones, UA: NEGATIVE
NITRITE UA: NEGATIVE
Protein, UA: NEGATIVE

## 2013-06-07 LAB — URINE CULTURE

## 2013-06-07 NOTE — Telephone Encounter (Signed)
Spoke with pt. Woke up yesterday am with fever, vomiting, vision problems. [facility] and was dx with UTI. Was put on Macrobid. Pt states she was to follow up within 2 days. Call transferred to front desk to schedule an appt for today. JSY

## 2013-06-07 NOTE — Progress Notes (Signed)
Was seen in ER yesterday for UTI, worked in today for nausea, vomiting clear, has cough and congestion and she has urticaria when gets upset,Lungs clear,no throat redness, rash fading, continue macrobid, rest, push fluids,take benadryl and zantac and can take delsym for cough.Will give note to return to work 1/26.return 2/9 as scheduled.

## 2013-06-07 NOTE — Patient Instructions (Signed)
Take med push fluids  Take benadryl and zantac Return as scheduled

## 2013-06-24 ENCOUNTER — Ambulatory Visit (INDEPENDENT_AMBULATORY_CARE_PROVIDER_SITE_OTHER): Payer: Medicaid Other | Admitting: Women's Health

## 2013-06-24 ENCOUNTER — Encounter: Payer: Self-pay | Admitting: Women's Health

## 2013-06-24 VITALS — BP 138/62 | Wt 285.5 lb

## 2013-06-24 DIAGNOSIS — O2390 Unspecified genitourinary tract infection in pregnancy, unspecified trimester: Secondary | ICD-10-CM

## 2013-06-24 DIAGNOSIS — Z331 Pregnant state, incidental: Secondary | ICD-10-CM

## 2013-06-24 DIAGNOSIS — Z3491 Encounter for supervision of normal pregnancy, unspecified, first trimester: Secondary | ICD-10-CM

## 2013-06-24 DIAGNOSIS — B3731 Acute candidiasis of vulva and vagina: Secondary | ICD-10-CM

## 2013-06-24 DIAGNOSIS — O99320 Drug use complicating pregnancy, unspecified trimester: Secondary | ICD-10-CM

## 2013-06-24 DIAGNOSIS — IMO0001 Reserved for inherently not codable concepts without codable children: Secondary | ICD-10-CM | POA: Insufficient documentation

## 2013-06-24 DIAGNOSIS — R300 Dysuria: Secondary | ICD-10-CM

## 2013-06-24 DIAGNOSIS — B373 Candidiasis of vulva and vagina: Secondary | ICD-10-CM

## 2013-06-24 DIAGNOSIS — F1920 Other psychoactive substance dependence, uncomplicated: Secondary | ICD-10-CM

## 2013-06-24 DIAGNOSIS — N898 Other specified noninflammatory disorders of vagina: Secondary | ICD-10-CM

## 2013-06-24 DIAGNOSIS — O26892 Other specified pregnancy related conditions, second trimester: Secondary | ICD-10-CM

## 2013-06-24 DIAGNOSIS — Z1389 Encounter for screening for other disorder: Secondary | ICD-10-CM

## 2013-06-24 DIAGNOSIS — R030 Elevated blood-pressure reading, without diagnosis of hypertension: Secondary | ICD-10-CM

## 2013-06-24 DIAGNOSIS — F1290 Cannabis use, unspecified, uncomplicated: Secondary | ICD-10-CM | POA: Insufficient documentation

## 2013-06-24 LAB — POCT URINALYSIS DIPSTICK
Glucose, UA: NEGATIVE
Ketones, UA: NEGATIVE
Nitrite, UA: NEGATIVE
PROTEIN UA: NEGATIVE

## 2013-06-24 LAB — POCT WET PREP (WET MOUNT): CLUE CELLS WET PREP WHIFF POC: NEGATIVE

## 2013-06-24 MED ORDER — FLUCONAZOLE 150 MG PO TABS: 150.0000 mg | ORAL_TABLET | Freq: Once | ORAL | Status: DC

## 2013-06-24 NOTE — Progress Notes (Signed)
Denies cramping, lof, vb. Yellow nonodorous d/c w/ vulvar itching and burning w/ urination x 2-3d. Had UTI earlier in pregnancy, this doesn't feel the same. Spec exam: large amount thick white clumpy nonodorous d/c, cx visually closed. Wet prep: mod wbc's, +yeast. Rx diflucan, send urine for cx & gc/ch, urine P:C ratio d/t couple borderline bp's. Mom states lots of DM in their family, coupled w/ pt's BMI will get early 2hr gtt.  Reviewed warning s/s to report.  All questions answered. F/U in 1d for early 2hr gtt and 2nd IT, then 4wks for anatomy u/s and visit.

## 2013-06-24 NOTE — Patient Instructions (Signed)
You will have your sugar test next visit.  Please do not eat or drink anything after midnight the night before you come, not even water.  You will be here for at least two hours.     Second Trimester of Pregnancy The second trimester is from week 13 through week 28, months 4 through 6. The second trimester is often a time when you feel your best. Your body has also adjusted to being pregnant, and you begin to feel better physically. Usually, morning sickness has lessened or quit completely, you may have more energy, and you may have an increase in appetite. The second trimester is also a time when the fetus is growing rapidly. At the end of the sixth month, the fetus is about 9 inches long and weighs about 1 pounds. You will likely begin to feel the baby move (quickening) between 18 and 20 weeks of the pregnancy. BODY CHANGES Your body goes through many changes during pregnancy. The changes vary from woman to woman.   Your weight will continue to increase. You will notice your lower abdomen bulging out.  You may begin to get stretch marks on your hips, abdomen, and breasts.  You may develop headaches that can be relieved by medicines approved by your caregiver.  You may urinate more often because the fetus is pressing on your bladder.  You may develop or continue to have heartburn as a result of your pregnancy.  You may develop constipation because certain hormones are causing the muscles that push waste through your intestines to slow down.  You may develop hemorrhoids or swollen, bulging veins (varicose veins).  You may have back pain because of the weight gain and pregnancy hormones relaxing your joints between the bones in your pelvis and as a result of a shift in weight and the muscles that support your balance.  Your breasts will continue to grow and be tender.  Your gums may bleed and may be sensitive to brushing and flossing.  Dark spots or blotches (chloasma, mask of pregnancy)  may develop on your face. This will likely fade after the baby is born.  A dark line from your belly button to the pubic area (linea nigra) may appear. This will likely fade after the baby is born. WHAT TO EXPECT AT YOUR PRENATAL VISITS During a routine prenatal visit:  You will be weighed to make sure you and the fetus are growing normally.  Your blood pressure will be taken.  Your abdomen will be measured to track your baby's growth.  The fetal heartbeat will be listened to.  Any test results from the previous visit will be discussed. Your caregiver may ask you:  How you are feeling.  If you are feeling the baby move.  If you have had any abnormal symptoms, such as leaking fluid, bleeding, severe headaches, or abdominal cramping.  If you have any questions. Other tests that may be performed during your second trimester include:  Blood tests that check for:  Low iron levels (anemia).  Gestational diabetes (between 24 and 28 weeks).  Rh antibodies.  Urine tests to check for infections, diabetes, or protein in the urine.  An ultrasound to confirm the proper growth and development of the baby.  An amniocentesis to check for possible genetic problems.  Fetal screens for spina bifida and Down syndrome. HOME CARE INSTRUCTIONS   Avoid all smoking, herbs, alcohol, and unprescribed drugs. These chemicals affect the formation and growth of the baby.  Follow your caregiver's   instructions regarding medicine use. There are medicines that are either safe or unsafe to take during pregnancy.  Exercise only as directed by your caregiver. Experiencing uterine cramps is a good sign to stop exercising.  Continue to eat regular, healthy meals.  Wear a good support bra for breast tenderness.  Do not use hot tubs, steam rooms, or saunas.  Wear your seat belt at all times when driving.  Avoid raw meat, uncooked cheese, cat litter boxes, and soil used by cats. These carry germs that  can cause birth defects in the baby.  Take your prenatal vitamins.  Try taking a stool softener (if your caregiver approves) if you develop constipation. Eat more high-fiber foods, such as fresh vegetables or fruit and whole grains. Drink plenty of fluids to keep your urine clear or pale yellow.  Take warm sitz baths to soothe any pain or discomfort caused by hemorrhoids. Use hemorrhoid cream if your caregiver approves.  If you develop varicose veins, wear support hose. Elevate your feet for 15 minutes, 3 4 times a day. Limit salt in your diet.  Avoid heavy lifting, wear low heel shoes, and practice good posture.  Rest with your legs elevated if you have leg cramps or low back pain.  Visit your dentist if you have not gone yet during your pregnancy. Use a soft toothbrush to brush your teeth and be gentle when you floss.  A sexual relationship may be continued unless your caregiver directs you otherwise.  Continue to go to all your prenatal visits as directed by your caregiver. SEEK MEDICAL CARE IF:   You have dizziness.  You have mild pelvic cramps, pelvic pressure, or nagging pain in the abdominal area.  You have persistent nausea, vomiting, or diarrhea.  You have a bad smelling vaginal discharge.  You have pain with urination. SEEK IMMEDIATE MEDICAL CARE IF:   You have a fever.  You are leaking fluid from your vagina.  You have spotting or bleeding from your vagina.  You have severe abdominal cramping or pain.  You have rapid weight gain or loss.  You have shortness of breath with chest pain.  You notice sudden or extreme swelling of your face, hands, ankles, feet, or legs.  You have not felt your baby move in over an hour.  You have severe headaches that do not go away with medicine.  You have vision changes. Document Released: 04/26/2001 Document Revised: 01/02/2013 Document Reviewed: 07/03/2012 [employer] Patient Information 2014 [employer].  

## 2013-06-25 ENCOUNTER — Other Ambulatory Visit: Payer: Self-pay | Admitting: Women's Health

## 2013-06-25 ENCOUNTER — Other Ambulatory Visit: Payer: Medicaid Other

## 2013-06-25 ENCOUNTER — Encounter: Payer: Self-pay | Admitting: Women's Health

## 2013-06-25 DIAGNOSIS — Z833 Family history of diabetes mellitus: Secondary | ICD-10-CM

## 2013-06-25 LAB — PROTEIN / CREATININE RATIO, URINE
CREATININE, URINE: 172.1 mg/dL
PROTEIN CREATININE RATIO: 0.1 (ref <0.15)
TOTAL PROTEIN, URINE: 17 mg/dL

## 2013-06-25 LAB — URINE CULTURE
Colony Count: NO GROWTH
ORGANISM ID, BACTERIA: NO GROWTH

## 2013-06-25 LAB — GC/CHLAMYDIA PROBE AMP
CT Probe RNA: NEGATIVE
GC Probe RNA: NEGATIVE

## 2013-06-26 ENCOUNTER — Encounter: Payer: Self-pay | Admitting: Women's Health

## 2013-06-26 LAB — GLUCOSE TOLERANCE, 2 HOURS W/ 1HR
GLUCOSE, FASTING: 79 mg/dL (ref 70–99)
Glucose, 1 hour: 154 mg/dL (ref 70–170)
Glucose, 2 hour: 94 mg/dL (ref 70–139)

## 2013-06-29 LAB — MATERNAL SCREEN, INTEGRATED #2
AFP MoM: 0.86
AFP, Serum: 21.6 ng/mL
Age risk Down Syndrome: 1:1100 {titer}
CROWN RUMP LENGTH MAT SCREEN 2: 67.2 mm
Calculated Gestational Age: 17
ESTRIOL MOM MAT SCREEN: 1.24
Estriol, Free: 1.07 ng/mL
HCG, SERUM MAT SCREEN: 54.4 [IU]/mL
Inhibin A Dimeric: 225 pg/mL
Inhibin A MoM: 1.79
MSS Down Syndrome: <1:5000 {titer}
NT MOM MAT SCREEN: 1.01
Nuchal Translucency: 1.52 mm
Number of fetuses: 1
PAPP-A MOM MAT SCREEN: 0.62
PAPP-A: 292 ng/mL
Rish for ONTD: <1:5000 {titer}
hCG MoM: 2.66

## 2013-06-30 ENCOUNTER — Encounter: Payer: Self-pay | Admitting: Women's Health

## 2013-07-22 ENCOUNTER — Ambulatory Visit (INDEPENDENT_AMBULATORY_CARE_PROVIDER_SITE_OTHER): Payer: Medicaid Other

## 2013-07-22 ENCOUNTER — Ambulatory Visit (INDEPENDENT_AMBULATORY_CARE_PROVIDER_SITE_OTHER): Payer: Medicaid Other | Admitting: Obstetrics and Gynecology

## 2013-07-22 ENCOUNTER — Encounter: Payer: Self-pay | Admitting: Obstetrics and Gynecology

## 2013-07-22 ENCOUNTER — Other Ambulatory Visit: Payer: Self-pay | Admitting: Women's Health

## 2013-07-22 VITALS — BP 132/58 | Wt 288.8 lb

## 2013-07-22 DIAGNOSIS — O99320 Drug use complicating pregnancy, unspecified trimester: Secondary | ICD-10-CM

## 2013-07-22 DIAGNOSIS — Z3491 Encounter for supervision of normal pregnancy, unspecified, first trimester: Secondary | ICD-10-CM

## 2013-07-22 DIAGNOSIS — Z1389 Encounter for screening for other disorder: Secondary | ICD-10-CM

## 2013-07-22 DIAGNOSIS — O99210 Obesity complicating pregnancy, unspecified trimester: Secondary | ICD-10-CM

## 2013-07-22 DIAGNOSIS — E669 Obesity, unspecified: Secondary | ICD-10-CM

## 2013-07-22 DIAGNOSIS — F1920 Other psychoactive substance dependence, uncomplicated: Secondary | ICD-10-CM

## 2013-07-22 DIAGNOSIS — Z331 Pregnant state, incidental: Secondary | ICD-10-CM | POA: Insufficient documentation

## 2013-07-22 LAB — POCT URINALYSIS DIPSTICK
Blood, UA: NEGATIVE
GLUCOSE UA: 100
Ketones, UA: NEGATIVE
NITRITE UA: NEGATIVE

## 2013-07-22 NOTE — Patient Instructions (Signed)
Preterm laborPreterm Labor Information Preterm labor is when labor starts at less than 37 weeks of pregnancy. The normal length of a pregnancy is 39 to 41 weeks. CAUSES Often, there is no identifiable underlying cause as to why a woman goes into preterm labor. One of the most common known causes of preterm labor is infection. Infections of the uterus, cervix, vagina, amniotic sac, bladder, kidney, or even the lungs (pneumonia) can cause labor to start. Other suspected causes of preterm labor include:   Urogenital infections, such as yeast infections and bacterial vaginosis.   Uterine abnormalities (uterine shape, uterine septum, fibroids, or bleeding from the placenta).   A cervix that has been operated on (it may fail to stay closed).   Malformations in the fetus.   Multiple gestations (twins, triplets, and so on).   Breakage of the amniotic sac.  RISK FACTORS  Having a previous history of preterm labor.   Having premature rupture of membranes (PROM).   Having a placenta that covers the opening of the cervix (placenta previa).   Having a placenta that separates from the uterus (placental abruption).   Having a cervix that is too weak to hold the fetus in the uterus (incompetent cervix).   Having too much fluid in the amniotic sac (polyhydramnios).   Taking illegal drugs or smoking while pregnant.   Not gaining enough weight while pregnant.   Being younger than 5418 and older than 20 years old.   Having a low socioeconomic status.   Being African American. SYMPTOMS Signs and symptoms of preterm labor include:   Menstrual-like cramps, abdominal pain, or back pain.  Uterine contractions that are regular, as frequent as six in an hour, regardless of their intensity (may be mild or painful).  Contractions that start on the top of the uterus and spread down to the lower abdomen and back.   A sense of increased pelvic pressure.   A watery or bloody mucus  discharge that comes from the vagina.  TREATMENT Depending on the length of the pregnancy and other circumstances, your health care provider may suggest bed rest. If necessary, there are medicines that can be given to stop contractions and to mature the fetal lungs. If labor happens before 34 weeks of pregnancy, a prolonged hospital stay may be recommended. Treatment depends on the condition of both you and the fetus.  WHAT SHOULD YOU DO IF YOU THINK YOU ARE IN PRETERM LABOR? Call your health care provider right away. You will need to go to the hospital to get checked immediately. HOW CAN YOU PREVENT PRETERM LABOR IN FUTURE PREGNANCIES? You should:   Stop smoking if you smoke.  Maintain healthy weight gain and avoid chemicals and drugs that are not necessary.  Be watchful for any type of infection.  Inform your health care provider if you have a known history of preterm labor. Document Released: 07/23/2003 Document Revised: 01/02/2013 Document Reviewed: 06/04/2012 [facility] Patient Information 2014 [place], [place].

## 2013-07-22 NOTE — Progress Notes (Signed)
167w4d female presents for routine prenatal visit and ultrasound. States she has not noticed fetal movement yet. Ultrasound reviewed , normal fetal survey c/w dates, good cervix length, anterior placenta grade 0. Normal af volume Advised pt be physically active and that 0-10 lb wt gain in pregnancy is goal. P ;routine PNC. Classes encouraged. FOB supportive.

## 2013-07-22 NOTE — Progress Notes (Signed)
U/S(20+4wks)-single active fetus, meas c/w dates, anterior Gr 0 placenta, cx appears closed (3.9cm), bilateral adnexa wnl, FHR- 148 bpm, no major abnl noted

## 2013-07-24 ENCOUNTER — Encounter: Payer: Self-pay | Admitting: Women's Health

## 2013-07-31 ENCOUNTER — Ambulatory Visit (INDEPENDENT_AMBULATORY_CARE_PROVIDER_SITE_OTHER): Payer: Medicaid Other | Admitting: Advanced Practice Midwife

## 2013-07-31 ENCOUNTER — Encounter: Payer: Self-pay | Admitting: Advanced Practice Midwife

## 2013-07-31 VITALS — BP 130/58 | Wt 289.0 lb

## 2013-07-31 DIAGNOSIS — F1290 Cannabis use, unspecified, uncomplicated: Secondary | ICD-10-CM

## 2013-07-31 DIAGNOSIS — Z331 Pregnant state, incidental: Secondary | ICD-10-CM

## 2013-07-31 DIAGNOSIS — Z1389 Encounter for screening for other disorder: Secondary | ICD-10-CM

## 2013-07-31 DIAGNOSIS — O2390 Unspecified genitourinary tract infection in pregnancy, unspecified trimester: Secondary | ICD-10-CM

## 2013-07-31 DIAGNOSIS — E669 Obesity, unspecified: Secondary | ICD-10-CM

## 2013-07-31 DIAGNOSIS — O99210 Obesity complicating pregnancy, unspecified trimester: Secondary | ICD-10-CM

## 2013-07-31 LAB — POCT URINALYSIS DIPSTICK
Blood, UA: NEGATIVE
Glucose, UA: NEGATIVE
Ketones, UA: NEGATIVE
Nitrite, UA: NEGATIVE
PROTEIN UA: NEGATIVE

## 2013-07-31 MED ORDER — FLUCONAZOLE 150 MG PO TABS: ORAL_TABLET | ORAL | Status: DC

## 2013-07-31 MED ORDER — METRONIDAZOLE 500 MG PO TABS
500.0000 mg | ORAL_TABLET | Freq: Two times a day (BID) | ORAL | Status: DC
Start: 2013-07-31 — End: 2013-08-19

## 2013-07-31 NOTE — Progress Notes (Signed)
C/O vaginal discharge/itching for 2 days.  SSE:  Thick white clumpy d/c/  Wet prep yeast, few clues, moderate bacteria.  Rx diflucan and flagyl.

## 2013-08-01 LAB — DRUG SCREEN, URINE, NO CONFIRMATION
Amphetamine Screen, Ur: NEGATIVE
BENZODIAZEPINES.: NEGATIVE
Barbiturate Quant, Ur: NEGATIVE
Cocaine Metabolites: NEGATIVE
Creatinine,U: 42.9 mg/dL
METHADONE: NEGATIVE
Marijuana Metabolite: NEGATIVE
Opiate Screen, Urine: NEGATIVE
Phencyclidine (PCP): NEGATIVE
Propoxyphene: NEGATIVE

## 2013-08-19 ENCOUNTER — Encounter: Payer: Self-pay | Admitting: Adult Health

## 2013-08-19 ENCOUNTER — Ambulatory Visit (INDEPENDENT_AMBULATORY_CARE_PROVIDER_SITE_OTHER): Payer: Medicaid Other | Admitting: Adult Health

## 2013-08-19 VITALS — BP 124/80 | Wt 295.0 lb

## 2013-08-19 DIAGNOSIS — Z331 Pregnant state, incidental: Secondary | ICD-10-CM

## 2013-08-19 DIAGNOSIS — E669 Obesity, unspecified: Secondary | ICD-10-CM

## 2013-08-19 DIAGNOSIS — O99210 Obesity complicating pregnancy, unspecified trimester: Secondary | ICD-10-CM

## 2013-08-19 DIAGNOSIS — Z1389 Encounter for screening for other disorder: Secondary | ICD-10-CM

## 2013-08-19 LAB — POCT URINALYSIS DIPSTICK
Blood, UA: NEGATIVE
Glucose, UA: NEGATIVE
Ketones, UA: NEGATIVE
Leukocytes, UA: NEGATIVE
Nitrite, UA: NEGATIVE
PROTEIN UA: NEGATIVE

## 2013-08-19 NOTE — Progress Notes (Signed)
Pt states that she has never felt the baby move at all.

## 2013-08-19 NOTE — Progress Notes (Signed)
Has had swelling in hands and feet, no headaches or blurred vision or pain under ribs,discussed watching weight gain, has not felt any baby movement yet, +FCM on US 155 and +FM, has anterior placenta.Decrease salt and sugar and fast foods and increase water will follow up in 4 weeks for PN2.

## 2013-08-19 NOTE — Patient Instructions (Signed)
Third Trimester of Pregnancy The third trimester is from week 29 through week 42, months 7 through 9. The third trimester is a time when the fetus is growing rapidly. At the end of the ninth month, the fetus is about 20 inches in length and weighs 6 10 pounds.  BODY CHANGES Your body goes through many changes during pregnancy. The changes vary from woman to woman.   Your weight will continue to increase. You can expect to gain 25 35 pounds (11 16 kg) by the end of the pregnancy.  You may begin to get stretch marks on your hips, abdomen, and breasts.  You may urinate more often because the fetus is moving lower into your pelvis and pressing on your bladder.  You may develop or continue to have heartburn as a result of your pregnancy.  You may develop constipation because certain hormones are causing the muscles that push waste through your intestines to slow down.  You may develop hemorrhoids or swollen, bulging veins (varicose veins).  You may have pelvic pain because of the weight gain and pregnancy hormones relaxing your joints between the bones in your pelvis. Back aches may result from over exertion of the muscles supporting your posture.  Your breasts will continue to grow and be tender. A yellow discharge may leak from your breasts called colostrum.  Your belly button may stick out.  You may feel short of breath because of your expanding uterus.  You may notice the fetus "dropping," or moving lower in your abdomen.  You may have a bloody mucus discharge. This usually occurs a few days to a week before labor begins.  Your cervix becomes thin and soft (effaced) near your due date. WHAT TO EXPECT AT YOUR PRENATAL EXAMS  You will have prenatal exams every 2 weeks until week 36. Then, you will have weekly prenatal exams. During a routine prenatal visit:  You will be weighed to make sure you and the fetus are growing normally.  Your blood pressure is taken.  Your abdomen will be  measured to track your baby's growth.  The fetal heartbeat will be listened to.  Any test results from the previous visit will be discussed.  You may have a cervical check near your due date to see if you have effaced. At around 36 weeks, your caregiver will check your cervix. At the same time, your caregiver will also perform a test on the secretions of the vaginal tissue. This test is to determine if a type of bacteria, Group B streptococcus, is present. Your caregiver will explain this further. Your caregiver may ask you:  What your birth plan is.  How you are feeling.  If you are feeling the baby move.  If you have had any abnormal symptoms, such as leaking fluid, bleeding, severe headaches, or abdominal cramping.  If you have any questions. Other tests or screenings that may be performed during your third trimester include:  Blood tests that check for low iron levels (anemia).  Fetal testing to check the health, activity level, and growth of the fetus. Testing is done if you have certain medical conditions or if there are problems during the pregnancy. FALSE LABOR You may feel small, irregular contractions that eventually go away. These are called Braxton Hicks contractions, or false labor. Contractions may last for hours, days, or even weeks before true labor sets in. If contractions come at regular intervals, intensify, or become painful, it is best to be seen by your caregiver.  SIGNS OF LABOR   Menstrual-like cramps.  Contractions that are 5 minutes apart or less.  Contractions that start on the top of the uterus and spread down to the lower abdomen and back.  A sense of increased pelvic pressure or back pain.  A watery or bloody mucus discharge that comes from the vagina. If you have any of these signs before the 37th week of pregnancy, call your caregiver right away. You need to go to the hospital to get checked immediately. HOME CARE INSTRUCTIONS   Avoid all  smoking, herbs, alcohol, and unprescribed drugs. These chemicals affect the formation and growth of the baby.  Follow your caregiver's instructions regarding medicine use. There are medicines that are either safe or unsafe to take during pregnancy.  Exercise only as directed by your caregiver. Experiencing uterine cramps is a good sign to stop exercising.  Continue to eat regular, healthy meals.  Wear a good support bra for breast tenderness.  Do not use hot tubs, steam rooms, or saunas.  Wear your seat belt at all times when driving.  Avoid raw meat, uncooked cheese, cat litter boxes, and soil used by cats. These carry germs that can cause birth defects in the baby.  Take your prenatal vitamins.  Try taking a stool softener (if your caregiver approves) if you develop constipation. Eat more high-fiber foods, such as fresh vegetables or fruit and whole grains. Drink plenty of fluids to keep your urine clear or pale yellow.  Take warm sitz baths to soothe any pain or discomfort caused by hemorrhoids. Use hemorrhoid cream if your caregiver approves.  If you develop varicose veins, wear support hose. Elevate your feet for 15 minutes, 3 4 times a day. Limit salt in your diet.  Avoid heavy lifting, wear low heal shoes, and practice good posture.  Rest a lot with your legs elevated if you have leg cramps or low back pain.  Visit your dentist if you have not gone during your pregnancy. Use a soft toothbrush to brush your teeth and be gentle when you floss.  A sexual relationship may be continued unless your caregiver directs you otherwise.  Do not travel far distances unless it is absolutely necessary and only with the approval of your caregiver.  Take prenatal classes to understand, practice, and ask questions about the labor and delivery.  Make a trial run to the hospital.  Pack your hospital bag.  Prepare the baby's nursery.  Continue to go to all your prenatal visits as directed  by your caregiver. SEEK MEDICAL CARE IF:  You are unsure if you are in labor or if your water has broken.  You have dizziness.  You have mild pelvic cramps, pelvic pressure, or nagging pain in your abdominal area.  You have persistent nausea, vomiting, or diarrhea.  You have a bad smelling vaginal discharge.  You have pain with urination. SEEK IMMEDIATE MEDICAL CARE IF:   You have a fever.  You are leaking fluid from your vagina.  You have spotting or bleeding from your vagina.  You have severe abdominal cramping or pain.  You have rapid weight loss or gain.  You have shortness of breath with chest pain.  You notice sudden or extreme swelling of your face, hands, ankles, feet, or legs.  You have not felt your baby move in over an hour.  You have severe headaches that do not go away with medicine.  You have vision changes. Document Released: 04/26/2001 Document Revised: 01/02/2013 Document Reviewed:   07/03/2012 [employer] Patient Information 2014 [place], [place]. Return in 4 weeks for PN2 and see Dr Despina HiddenEure Look for movement

## 2013-08-22 ENCOUNTER — Telehealth: Payer: Self-pay | Admitting: Obstetrics and Gynecology

## 2013-08-27 NOTE — Telephone Encounter (Signed)
No answer x 3 attempts

## 2013-09-03 ENCOUNTER — Ambulatory Visit (INDEPENDENT_AMBULATORY_CARE_PROVIDER_SITE_OTHER): Payer: Medicaid Other | Admitting: Women's Health

## 2013-09-03 ENCOUNTER — Encounter: Payer: Self-pay | Admitting: Women's Health

## 2013-09-03 ENCOUNTER — Encounter: Payer: Medicaid Other | Admitting: Advanced Practice Midwife

## 2013-09-03 ENCOUNTER — Telehealth: Payer: Self-pay | Admitting: *Deleted

## 2013-09-03 VITALS — BP 126/58 | Wt 296.6 lb

## 2013-09-03 DIAGNOSIS — Z331 Pregnant state, incidental: Secondary | ICD-10-CM

## 2013-09-03 DIAGNOSIS — Z1389 Encounter for screening for other disorder: Secondary | ICD-10-CM

## 2013-09-03 DIAGNOSIS — O99210 Obesity complicating pregnancy, unspecified trimester: Secondary | ICD-10-CM

## 2013-09-03 DIAGNOSIS — E669 Obesity, unspecified: Secondary | ICD-10-CM

## 2013-09-03 DIAGNOSIS — O4690 Antepartum hemorrhage, unspecified, unspecified trimester: Secondary | ICD-10-CM

## 2013-09-03 LAB — POCT URINALYSIS DIPSTICK
Blood, UA: NEGATIVE
Glucose, UA: NEGATIVE
Ketones, UA: NEGATIVE
LEUKOCYTES UA: NEGATIVE
Nitrite, UA: NEGATIVE
PROTEIN UA: NEGATIVE

## 2013-09-03 NOTE — Telephone Encounter (Signed)
Pt called office this am stating woke up this am with vaginal bleeding and lower back pain.Pt states is concerned. Pt told to come in now for evaluation.

## 2013-09-03 NOTE — Progress Notes (Signed)
Work-in: LBP last night, got up to void this am, had bright red blood trickle down leg around 0600- none since. Last sexual activity 2wks ago, no constipation. Spec exam: cx visually closed, normal nonodorous d/c, no blood in vault or from os. SVE: LTC, anterior. Reports good fm. Denies uc's, lof, uti s/s. Reviewed ptl s/s, fm, reasons to return/go to [facility].  Pelvic rest for now. All questions answered. F/U asap for u/s to evaluate placenta, then as scheduled 5/4 for pn2 and visit.

## 2013-09-03 NOTE — Patient Instructions (Signed)
Vaginal Bleeding During Pregnancy, Second Trimester °A small amount of bleeding (spotting) from the vagina is relatively common in pregnancy. It usually stops on its own. Various things can cause bleeding or spotting in pregnancy. Some bleeding may be related to the pregnancy, and some may not. Sometimes the bleeding is normal and is not a problem. However, bleeding can also be a sign of something serious. Be sure to tell your health care provider about any vaginal bleeding right away. °Some possible causes of vaginal bleeding during the second trimester include: °· Infection, inflammation, or growths on the cervix.   °· The placenta may be partially or completely covering the opening of the cervix inside the uterus (placenta previa). °· The placenta may have separated from the uterus (abruption of the placenta).   °· You may be having early (preterm) labor.   °· The cervix may not be strong enough to keep a baby inside the uterus (cervical insufficiency).   °· Tiny cysts may have developed in the uterus instead of pregnancy tissue (molar pregnancy).  °HOME CARE INSTRUCTIONS  °Watch your condition for any changes. The following actions may help to lessen any discomfort you are feeling: °· Follow your health care provider's instructions for limiting your activity. If your health care provider orders bed rest, you may need to stay in bed and only get up to use the bathroom. However, your health care provider may allow you to continue light activity. °· If needed, make plans for someone to help with your regular activities and responsibilities while you are on bed rest. °· Keep track of the number of pads you use each day, how often you change pads, and how soaked (saturated) they are. Write this down. °· Do not use tampons. Do not douche. °· Do not have sexual intercourse or orgasms until approved by your health care provider. °· If you pass any tissue from your vagina, save the tissue so you can show it to your  health care provider. °· Only take over-the-counter or prescription medicines as directed by your health care provider. °· Do not take aspirin because it can make you bleed. °· Do not exercise or perform any strenuous activities or heavy lifting without your health care provider's permission. °· Keep all follow-up appointments as directed by your health care provider. °SEEK MEDICAL CARE IF: °· You have any vaginal bleeding during any part of your pregnancy. °· You have cramps or labor pains. °SEEK IMMEDIATE MEDICAL CARE IF:  °· You have severe cramps in your back or belly (abdomen). °· You have contractions. °· You have a fever, not controlled by medicine. °· You have chills. °· You pass large clots or tissue from your vagina. °· Your bleeding increases. °· You feel lightheaded or weak, or you have fainting episodes. °· You are leaking fluid or have a gush of fluid from your vagina. °MAKE SURE YOU: °· Understand these instructions. °· Will watch your condition. °· Will get help right away if you are not doing well or get worse. °Document Released: 02/09/2005 Document Revised: 02/20/2013 Document Reviewed: 01/07/2013 °[employer]® Patient Information ©2014 [employer]. ° °

## 2013-09-04 LAB — GC/CHLAMYDIA PROBE AMP
CT Probe RNA: NEGATIVE
GC Probe RNA: NEGATIVE

## 2013-09-04 LAB — URINE CULTURE
Colony Count: NO GROWTH
Organism ID, Bacteria: NO GROWTH

## 2013-09-06 ENCOUNTER — Ambulatory Visit (INDEPENDENT_AMBULATORY_CARE_PROVIDER_SITE_OTHER): Payer: Medicaid Other

## 2013-09-06 DIAGNOSIS — O4690 Antepartum hemorrhage, unspecified, unspecified trimester: Secondary | ICD-10-CM

## 2013-09-06 NOTE — Progress Notes (Signed)
U/S(27+1wks)-vtx active fetus, approp growth EFW 2 lb 4 oz (51st%tile), fluid wnl Single Deepest Pocket-4.5cm, anterior Gr 1 placenta no areas of hemorrhage noted, cx appears closed (4.7cm), female fetus, FHR- 157 bpm

## 2013-09-16 ENCOUNTER — Ambulatory Visit (INDEPENDENT_AMBULATORY_CARE_PROVIDER_SITE_OTHER): Payer: Medicaid Other | Admitting: Obstetrics & Gynecology

## 2013-09-16 ENCOUNTER — Other Ambulatory Visit: Payer: Medicaid Other

## 2013-09-16 VITALS — BP 120/68 | Wt 294.5 lb

## 2013-09-16 DIAGNOSIS — Z3400 Encounter for supervision of normal first pregnancy, unspecified trimester: Secondary | ICD-10-CM

## 2013-09-16 DIAGNOSIS — Z1389 Encounter for screening for other disorder: Secondary | ICD-10-CM

## 2013-09-16 DIAGNOSIS — Z331 Pregnant state, incidental: Secondary | ICD-10-CM

## 2013-09-16 DIAGNOSIS — O4690 Antepartum hemorrhage, unspecified, unspecified trimester: Secondary | ICD-10-CM

## 2013-09-16 DIAGNOSIS — E669 Obesity, unspecified: Secondary | ICD-10-CM

## 2013-09-16 DIAGNOSIS — O99210 Obesity complicating pregnancy, unspecified trimester: Secondary | ICD-10-CM

## 2013-09-16 LAB — CBC
HEMATOCRIT: 35.3 % — AB (ref 36.0–46.0)
Hemoglobin: 11.8 g/dL — ABNORMAL LOW (ref 12.0–15.0)
MCH: 28.4 pg (ref 26.0–34.0)
MCHC: 33.4 g/dL (ref 30.0–36.0)
MCV: 85.1 fL (ref 78.0–100.0)
Platelets: 311 10*3/uL (ref 150–400)
RBC: 4.15 MIL/uL (ref 3.87–5.11)
RDW: 13.6 % (ref 11.5–15.5)
WBC: 13.8 10*3/uL — AB (ref 4.0–10.5)

## 2013-09-16 LAB — POCT URINALYSIS DIPSTICK
Glucose, UA: NEGATIVE
KETONES UA: NEGATIVE
Nitrite, UA: NEGATIVE
PROTEIN UA: NEGATIVE
RBC UA: NEGATIVE

## 2013-09-16 NOTE — Progress Notes (Signed)
BP weight and urine results all reviewed and noted. Patient reports good fetal movement, denies any bleeding and no rupture of membranes symptoms or regular contractions. Patient is without complaints. All questions were answered.  

## 2013-09-17 LAB — ANTIBODY SCREEN: Antibody Screen: NEGATIVE

## 2013-09-17 LAB — GLUCOSE TOLERANCE, 2 HOURS W/ 1HR
GLUCOSE, FASTING: 83 mg/dL (ref 70–99)
GLUCOSE: 148 mg/dL (ref 70–170)
Glucose, 2 hour: 107 mg/dL (ref 70–139)

## 2013-09-17 LAB — RPR

## 2013-09-17 LAB — HIV ANTIBODY (ROUTINE TESTING W REFLEX): HIV: NONREACTIVE

## 2013-09-17 LAB — HSV 2 ANTIBODY, IGG: HSV 2 GLYCOPROTEIN G AB, IGG: 0.17 IV

## 2013-09-22 ENCOUNTER — Encounter: Payer: Self-pay | Admitting: Obstetrics & Gynecology

## 2013-10-08 ENCOUNTER — Encounter: Payer: Self-pay | Admitting: Women's Health

## 2013-10-08 ENCOUNTER — Ambulatory Visit (INDEPENDENT_AMBULATORY_CARE_PROVIDER_SITE_OTHER): Payer: Self-pay | Admitting: Women's Health

## 2013-10-08 VITALS — BP 140/60 | Wt 299.6 lb

## 2013-10-08 DIAGNOSIS — O99320 Drug use complicating pregnancy, unspecified trimester: Secondary | ICD-10-CM

## 2013-10-08 DIAGNOSIS — R030 Elevated blood-pressure reading, without diagnosis of hypertension: Secondary | ICD-10-CM

## 2013-10-08 DIAGNOSIS — F1290 Cannabis use, unspecified, uncomplicated: Secondary | ICD-10-CM

## 2013-10-08 DIAGNOSIS — Z3400 Encounter for supervision of normal first pregnancy, unspecified trimester: Secondary | ICD-10-CM

## 2013-10-08 DIAGNOSIS — Z331 Pregnant state, incidental: Secondary | ICD-10-CM

## 2013-10-08 DIAGNOSIS — IMO0001 Reserved for inherently not codable concepts without codable children: Secondary | ICD-10-CM

## 2013-10-08 DIAGNOSIS — Z1389 Encounter for screening for other disorder: Secondary | ICD-10-CM

## 2013-10-08 DIAGNOSIS — F1920 Other psychoactive substance dependence, uncomplicated: Secondary | ICD-10-CM

## 2013-10-08 DIAGNOSIS — O9989 Other specified diseases and conditions complicating pregnancy, childbirth and the puerperium: Secondary | ICD-10-CM

## 2013-10-08 LAB — POCT URINALYSIS DIPSTICK
Glucose, UA: NEGATIVE
KETONES UA: NEGATIVE
LEUKOCYTES UA: NEGATIVE
Nitrite, UA: NEGATIVE

## 2013-10-08 LAB — CBC
HEMATOCRIT: 32.8 % — AB (ref 36.0–46.0)
HEMOGLOBIN: 11 g/dL — AB (ref 12.0–15.0)
MCH: 27.2 pg (ref 26.0–34.0)
MCHC: 33.5 g/dL (ref 30.0–36.0)
MCV: 81 fL (ref 78.0–100.0)
Platelets: 316 10*3/uL (ref 150–400)
RBC: 4.05 MIL/uL (ref 3.87–5.11)
RDW: 14.3 % (ref 11.5–15.5)
WBC: 13.4 10*3/uL — ABNORMAL HIGH (ref 4.0–10.5)

## 2013-10-08 NOTE — Progress Notes (Signed)
Reports good fm. Denies uc's, lof, vb, uti s/s.  +swelling, bp up today. Denies ha, scotomata, ruq/epigastric pain, n/v.  DTRs 2+, no clonus, 1+ BLE edema.  1+ proteinuria. Will check pre-e labs today. States last used THC prior to learning of pregnancy- will recheck UDS today. Recommended Tdap vaccine for pt & everyone who will be around baby. Reviewed pn2 results, pre-e s/s, ptl s/s, fkc.  All questions answered. F/U in 2d for bp check/visit.

## 2013-10-08 NOTE — Patient Instructions (Signed)
Circum[facility]: [facility]507 at hospital, [facility], has to be paid up front before it is done. If you [facility] you can make payments during pregnancy. If you are interested in this, see receptionist at check-out.    Call the office [phone]) or [facility] for these signs of pre-eclampsia:  Severe headache that does not go away with Tylenol  Visual changes- seeing spots, double, blurred vision  Pain under your right breast or upper abdomen that does not go away with Tums or heartburn medicine  Nausea and/or vomiting  Severe swelling in your hands, feet, and face   Third Trimester of Pregnancy The third trimester is from week 29 through week 42, months 7 through 9. The third trimester is a time when the fetus is growing rapidly. At the end of the ninth month, the fetus is about 20 inches in length and weighs 6 10 pounds.  BODY CHANGES Your body goes through many changes during pregnancy. The changes vary from woman to woman.   Your weight will continue to increase. You can expect to gain 25 35 pounds (11 16 kg) by the end of the pregnancy.  You may begin to get stretch marks on your hips, abdomen, and breasts.  You may urinate more often because the fetus is moving lower into your pelvis and pressing on your bladder.  You may develop or continue to have heartburn as a result of your pregnancy.  You may develop constipation because certain hormones are causing the muscles that push waste through your intestines to slow down.  You may develop hemorrhoids or swollen, bulging veins (varicose veins).  You may have pelvic pain because of the weight gain and pregnancy hormones relaxing your joints between the bones in your pelvis. Back aches may result from over exertion of the muscles supporting your posture.  Your breasts will continue to grow and be tender. A yellow discharge may leak from your breasts called colostrum.  Your belly button  may stick out.  You may feel short of breath because of your expanding uterus.  You may notice the fetus "dropping," or moving lower in your abdomen.  You may have a bloody mucus discharge. This usually occurs a few days to a week before labor begins.  Your cervix becomes thin and soft (effaced) near your due date. WHAT TO EXPECT AT YOUR PRENATAL EXAMS  You will have prenatal exams every 2 weeks until week 36. Then, you will have weekly prenatal exams. During a routine prenatal visit:  You will be weighed to make sure you and the fetus are growing normally.  Your blood pressure is taken.  Your abdomen will be measured to track your baby's growth.  The fetal heartbeat will be listened to.  Any test results from the previous visit will be discussed.  You may have a cervical check near your due date to see if you have effaced. At around 36 weeks, your caregiver will check your cervix. At the same time, your caregiver will also perform a test on the secretions of the vaginal tissue. This test is to determine if a type of bacteria, Group B streptococcus, is present. Your caregiver will explain this further. Your caregiver may ask you:  What your birth plan is.  How you are feeling.  If you are feeling the baby move.  If you have had any abnormal symptoms, such as leaking fluid, bleeding, severe headaches, or abdominal cramping.  If you  have any questions. Other tests or screenings that may be performed during your third trimester include:  Blood tests that check for low iron levels (anemia).  Fetal testing to check the health, activity level, and growth of the fetus. Testing is done if you have certain medical conditions or if there are problems during the pregnancy. FALSE LABOR You may feel small, irregular contractions that eventually go away. These are called Braxton Hicks contractions, or false labor. Contractions may last for hours, days, or even weeks before true labor sets  in. If contractions come at regular intervals, intensify, or become painful, it is best to be seen by your caregiver.  SIGNS OF LABOR   Menstrual-like cramps.  Contractions that are 5 minutes apart or less.  Contractions that start on the top of the uterus and spread down to the lower abdomen and back.  A sense of increased pelvic pressure or back pain.  A watery or bloody mucus discharge that comes from the vagina. If you have any of these signs before the 37th week of pregnancy, call your caregiver right away. You need to go to the hospital to get checked immediately. HOME CARE INSTRUCTIONS   Avoid all smoking, herbs, alcohol, and unprescribed drugs. These chemicals affect the formation and growth of the baby.  Follow your caregiver's instructions regarding medicine use. There are medicines that are either safe or unsafe to take during pregnancy.  Exercise only as directed by your caregiver. Experiencing uterine cramps is a good sign to stop exercising.  Continue to eat regular, healthy meals.  Wear a good support bra for breast tenderness.  Do not use hot tubs, steam rooms, or saunas.  Wear your seat belt at all times when driving.  Avoid raw meat, uncooked cheese, cat litter boxes, and soil used by cats. These carry germs that can cause birth defects in the baby.  Take your prenatal vitamins.  Try taking a stool softener (if your caregiver approves) if you develop constipation. Eat more high-fiber foods, such as fresh vegetables or fruit and whole grains. Drink plenty of fluids to keep your urine clear or pale yellow.  Take warm sitz baths to soothe any pain or discomfort caused by hemorrhoids. Use hemorrhoid cream if your caregiver approves.  If you develop varicose veins, wear support hose. Elevate your feet for 15 minutes, 3 4 times a day. Limit salt in your diet.  Avoid heavy lifting, wear low heal shoes, and practice good posture.  Rest a lot with your legs elevated  if you have leg cramps or low back pain.  Visit your dentist if you have not gone during your pregnancy. Use a soft toothbrush to brush your teeth and be gentle when you floss.  A sexual relationship may be continued unless your caregiver directs you otherwise.  Do not travel far distances unless it is absolutely necessary and only with the approval of your caregiver.  Take prenatal classes to understand, practice, and ask questions about the labor and delivery.  Make a trial run to the hospital.  Pack your hospital bag.  Prepare the baby's nursery.  Continue to go to all your prenatal visits as directed by your caregiver. SEEK MEDICAL CARE IF:  You are unsure if you are in labor or if your water has broken.  You have dizziness.  You have mild pelvic cramps, pelvic pressure, or nagging pain in your abdominal area.  You have persistent nausea, vomiting, or diarrhea.  You have a bad smelling vaginal  discharge.  You have pain with urination. SEEK IMMEDIATE MEDICAL CARE IF:   You have a fever.  You are leaking fluid from your vagina.  You have spotting or bleeding from your vagina.  You have severe abdominal cramping or pain.  You have rapid weight loss or gain.  You have shortness of breath with chest pain.  You notice sudden or extreme swelling of your face, hands, ankles, feet, or legs.  You have not felt your baby move in over an hour.  You have severe headaches that do not go away with medicine.  You have vision changes. Document Released: 04/26/2001 Document Revised: 01/02/2013 Document Reviewed: 07/03/2012 [facility] Patient Information 2014 [place], [place].

## 2013-10-09 ENCOUNTER — Encounter: Payer: Self-pay | Admitting: Women's Health

## 2013-10-09 LAB — DRUG SCREEN, URINE, NO CONFIRMATION
Amphetamine Screen, Ur: NEGATIVE
BENZODIAZEPINES.: NEGATIVE
Barbiturate Quant, Ur: NEGATIVE
CREATININE, U: 214.9 mg/dL
Cocaine Metabolites: NEGATIVE
METHADONE: NEGATIVE
Marijuana Metabolite: NEGATIVE
Opiate Screen, Urine: NEGATIVE
Phencyclidine (PCP): NEGATIVE
Propoxyphene: NEGATIVE

## 2013-10-09 LAB — COMPREHENSIVE METABOLIC PANEL
ALBUMIN: 2.9 g/dL — AB (ref 3.5–5.2)
ALT: 13 U/L (ref 0–35)
AST: 15 U/L (ref 0–37)
Alkaline Phosphatase: 109 U/L (ref 39–117)
BUN: 5 mg/dL — AB (ref 6–23)
CALCIUM: 8.7 mg/dL (ref 8.4–10.5)
CO2: 21 mEq/L (ref 19–32)
CREATININE: 0.59 mg/dL (ref 0.50–1.10)
Chloride: 105 mEq/L (ref 96–112)
GLUCOSE: 117 mg/dL — AB (ref 70–99)
POTASSIUM: 3.9 meq/L (ref 3.5–5.3)
Sodium: 134 mEq/L — ABNORMAL LOW (ref 135–145)
Total Bilirubin: 0.2 mg/dL (ref 0.2–1.1)
Total Protein: 5.7 g/dL — ABNORMAL LOW (ref 6.0–8.3)

## 2013-10-09 LAB — PROTEIN / CREATININE RATIO, URINE
Creatinine, Urine: 214 mg/dL
PROTEIN CREATININE RATIO: 0.1 (ref <0.15)
TOTAL PROTEIN, URINE: 21 mg/dL

## 2013-10-10 ENCOUNTER — Encounter: Payer: Self-pay | Admitting: Obstetrics and Gynecology

## 2013-10-10 ENCOUNTER — Ambulatory Visit (INDEPENDENT_AMBULATORY_CARE_PROVIDER_SITE_OTHER): Payer: Self-pay | Admitting: Obstetrics and Gynecology

## 2013-10-10 VITALS — BP 118/76 | Wt 298.0 lb

## 2013-10-10 DIAGNOSIS — Z331 Pregnant state, incidental: Secondary | ICD-10-CM

## 2013-10-10 DIAGNOSIS — Z3400 Encounter for supervision of normal first pregnancy, unspecified trimester: Secondary | ICD-10-CM

## 2013-10-10 DIAGNOSIS — Z1389 Encounter for screening for other disorder: Secondary | ICD-10-CM

## 2013-10-10 LAB — POCT URINALYSIS DIPSTICK
Blood, UA: NEGATIVE
Glucose, UA: NEGATIVE
Ketones, UA: NEGATIVE
LEUKOCYTES UA: NEGATIVE
NITRITE UA: NEGATIVE
PROTEIN UA: NEGATIVE

## 2013-10-10 NOTE — Patient Instructions (Signed)
Fetal Movement Counts Patient Name: __________________________________________________ Patient Due Date: ____________________ Performing a fetal movement count is highly recommended in high-risk pregnancies, but it is good for every pregnant woman to do. Your caregiver may ask you to start counting fetal movements at 28 weeks of the pregnancy. Fetal movements often increase:  After eating a full meal.  After physical activity.  After eating or drinking something sweet or cold.  At rest. Pay attention to when you feel the baby is most active. This will help you notice a pattern of your baby's sleep and wake cycles and what factors contribute to an increase in fetal movement. It is important to perform a fetal movement count at the same time each day when your baby is normally most active.  HOW TO COUNT FETAL MOVEMENTS 1. Find a quiet and comfortable area to sit or lie down on your left side. Lying on your left side provides the best blood and oxygen circulation to your baby. 2. Write down the day and time on a sheet of paper or in a journal. 3. Start counting kicks, flutters, swishes, rolls, or jabs in a 2 hour period. You should feel at least 10 movements within 2 hours. 4. If you do not feel 10 movements in 2 hours, wait 2 3 hours and count again. Look for a change in the pattern or not enough counts in 2 hours. SEEK MEDICAL CARE IF:  You feel less than 10 counts in 2 hours, tried twice.  There is no movement in over an hour.  The pattern is changing or taking longer each day to reach 10 counts in 2 hours.  You feel the baby is not moving as he or she usually does. Date: ____________ Movements: ____________ Start time: ____________ Finish time: ____________  Date: ____________ Movements: ____________ Start time: ____________ Finish time: ____________ Date: ____________ Movements: ____________ Start time: ____________ Finish time: ____________ Date: ____________ Movements: ____________  Start time: ____________ Finish time: ____________ Date: ____________ Movements: ____________ Start time: ____________ Finish time: ____________ Date: ____________ Movements: ____________ Start time: ____________ Finish time: ____________ Date: ____________ Movements: ____________ Start time: ____________ Finish time: ____________ Date: ____________ Movements: ____________ Start time: ____________ Finish time: ____________  Date: ____________ Movements: ____________ Start time: ____________ Finish time: ____________ Date: ____________ Movements: ____________ Start time: ____________ Finish time: ____________ Date: ____________ Movements: ____________ Start time: ____________ Finish time: ____________ Date: ____________ Movements: ____________ Start time: ____________ Finish time: ____________ Date: ____________ Movements: ____________ Start time: ____________ Finish time: ____________ Date: ____________ Movements: ____________ Start time: ____________ Finish time: ____________ Date: ____________ Movements: ____________ Start time: ____________ Finish time: ____________  Date: ____________ Movements: ____________ Start time: ____________ Finish time: ____________ Date: ____________ Movements: ____________ Start time: ____________ Finish time: ____________ Date: ____________ Movements: ____________ Start time: ____________ Finish time: ____________ Date: ____________ Movements: ____________ Start time: ____________ Finish time: ____________ Date: ____________ Movements: ____________ Start time: ____________ Finish time: ____________ Date: ____________ Movements: ____________ Start time: ____________ Finish time: ____________ Date: ____________ Movements: ____________ Start time: ____________ Finish time: ____________  Date: ____________ Movements: ____________ Start time: ____________ Finish time: ____________ Date: ____________ Movements: ____________ Start time: ____________ Finish time:  ____________ Date: ____________ Movements: ____________ Start time: ____________ Finish time: ____________ Date: ____________ Movements: ____________ Start time: ____________ Finish time: ____________ Date: ____________ Movements: ____________ Start time: ____________ Finish time: ____________ Date: ____________ Movements: ____________ Start time: ____________ Finish time: ____________ Date: ____________ Movements: ____________ Start time: ____________ Finish time: ____________  Date: ____________ Movements: ____________ Start time: ____________ Finish   time: ____________ Date: ____________ Movements: ____________ Start time: ____________ Finish time: ____________ Date: ____________ Movements: ____________ Start time: ____________ Finish time: ____________ Date: ____________ Movements: ____________ Start time: ____________ Finish time: ____________ Date: ____________ Movements: ____________ Start time: ____________ Finish time: ____________ Date: ____________ Movements: ____________ Start time: ____________ Finish time: ____________ Date: ____________ Movements: ____________ Start time: ____________ Finish time: ____________  Date: ____________ Movements: ____________ Start time: ____________ Finish time: ____________ Date: ____________ Movements: ____________ Start time: ____________ Finish time: ____________ Date: ____________ Movements: ____________ Start time: ____________ Finish time: ____________ Date: ____________ Movements: ____________ Start time: ____________ Finish time: ____________ Date: ____________ Movements: ____________ Start time: ____________ Finish time: ____________ Date: ____________ Movements: ____________ Start time: ____________ Finish time: ____________ Date: ____________ Movements: ____________ Start time: ____________ Finish time: ____________  Date: ____________ Movements: ____________ Start time: ____________ Finish time: ____________ Date: ____________ Movements:  ____________ Start time: ____________ Finish time: ____________ Date: ____________ Movements: ____________ Start time: ____________ Finish time: ____________ Date: ____________ Movements: ____________ Start time: ____________ Finish time: ____________ Date: ____________ Movements: ____________ Start time: ____________ Finish time: ____________ Date: ____________ Movements: ____________ Start time: ____________ Finish time: ____________ Date: ____________ Movements: ____________ Start time: ____________ Finish time: ____________  Date: ____________ Movements: ____________ Start time: ____________ Finish time: ____________ Date: ____________ Movements: ____________ Start time: ____________ Finish time: ____________ Date: ____________ Movements: ____________ Start time: ____________ Finish time: ____________ Date: ____________ Movements: ____________ Start time: ____________ Finish time: ____________ Date: ____________ Movements: ____________ Start time: ____________ Finish time: ____________ Date: ____________ Movements: ____________ Start time: ____________ Finish time: ____________ Document Released: 06/01/2006 Document Revised: 04/18/2012 Document Reviewed: 02/27/2012 [employer] Patient Information 2014 [employer].  

## 2013-10-10 NOTE — Progress Notes (Signed)
PT here for routine visit. PT denies any problems or concerns at this time.

## 2013-10-10 NOTE — Progress Notes (Signed)
Activity : walks 15 min /day x 3-4x /wk, asked to increase to 77minx5 /wk. Prenatal classes encouraged pt alleges interest. FOB supportive. Kick ct reviewed.

## 2013-10-21 ENCOUNTER — Encounter (HOSPITAL_COMMUNITY): Payer: Self-pay

## 2013-10-21 ENCOUNTER — Inpatient Hospital Stay (HOSPITAL_COMMUNITY)
Admission: AD | Admit: 2013-10-21 | Discharge: 2013-10-22 | Disposition: A | Discharge status: Home / Self Care | Payer: Medicaid Other | Source: Ambulatory Visit | Attending: Obstetrics and Gynecology | Admitting: Obstetrics and Gynecology

## 2013-10-21 DIAGNOSIS — IMO0001 Reserved for inherently not codable concepts without codable children: Secondary | ICD-10-CM

## 2013-10-21 DIAGNOSIS — Z833 Family history of diabetes mellitus: Secondary | ICD-10-CM | POA: Insufficient documentation

## 2013-10-21 DIAGNOSIS — O368190 Decreased fetal movements, unspecified trimester, not applicable or unspecified: Secondary | ICD-10-CM | POA: Insufficient documentation

## 2013-10-21 DIAGNOSIS — R030 Elevated blood-pressure reading, without diagnosis of hypertension: Secondary | ICD-10-CM

## 2013-10-21 DIAGNOSIS — Z3493 Encounter for supervision of normal pregnancy, unspecified, third trimester: Secondary | ICD-10-CM

## 2013-10-21 DIAGNOSIS — Z87891 Personal history of nicotine dependence: Secondary | ICD-10-CM | POA: Insufficient documentation

## 2013-10-21 NOTE — MAU Note (Signed)
No fetal movement since Sunday morning. Denies contractions/LOF/vaginal bleeding.

## 2013-10-21 NOTE — Discharge Instructions (Signed)
Third Trimester of Pregnancy  The third trimester is from week 29 through week 42, months 7 through 9. The third trimester is a time when the fetus is growing rapidly. At the end of the ninth month, the fetus is about 20 inches in length and weighs 6 10 pounds.   BODY CHANGES  Your body goes through many changes during pregnancy. The changes vary from woman to woman.    Your weight will continue to increase. You can expect to gain 25 35 pounds (11 16 kg) by the end of the pregnancy.   You may begin to get stretch marks on your hips, abdomen, and breasts.   You may urinate more often because the fetus is moving lower into your pelvis and pressing on your bladder.   You may develop or continue to have heartburn as a result of your pregnancy.   You may develop constipation because certain hormones are causing the muscles that push waste through your intestines to slow down.   You may develop hemorrhoids or swollen, bulging veins (varicose veins).   You may have pelvic pain because of the weight gain and pregnancy hormones relaxing your joints between the bones in your pelvis. Back aches may result from over exertion of the muscles supporting your posture.   Your breasts will continue to grow and be tender. A yellow discharge may leak from your breasts called colostrum.   Your belly button may stick out.   You may feel short of breath because of your expanding uterus.   You may notice the fetus "dropping," or moving lower in your abdomen.   You may have a bloody mucus discharge. This usually occurs a few days to a week before labor begins.   Your cervix becomes thin and soft (effaced) near your due date.  WHAT TO EXPECT AT YOUR PRENATAL EXAMS   You will have prenatal exams every 2 weeks until week 36. Then, you will have weekly prenatal exams. During a routine prenatal visit:   You will be weighed to make sure you and the fetus are growing normally.   Your blood pressure is taken.   Your abdomen will be  measured to track your baby's growth.   The fetal heartbeat will be listened to.   Any test results from the previous visit will be discussed.   You may have a cervical check near your due date to see if you have effaced.  At around 36 weeks, your caregiver will check your cervix. At the same time, your caregiver will also perform a test on the secretions of the vaginal tissue. This test is to determine if a type of bacteria, Group B streptococcus, is present. Your caregiver will explain this further.  Your caregiver may ask you:   What your birth plan is.   How you are feeling.   If you are feeling the baby move.   If you have had any abnormal symptoms, such as leaking fluid, bleeding, severe headaches, or abdominal cramping.   If you have any questions.  Other tests or screenings that may be performed during your third trimester include:   Blood tests that check for low iron levels (anemia).   Fetal testing to check the health, activity level, and growth of the fetus. Testing is done if you have certain medical conditions or if there are problems during the pregnancy.  FALSE LABOR  You may feel small, irregular contractions that eventually go away. These are called Braxton Hicks contractions, or   false labor. Contractions may last for hours, days, or even weeks before true labor sets in. If contractions come at regular intervals, intensify, or become painful, it is best to be seen by your caregiver.   SIGNS OF LABOR    Menstrual-like cramps.   Contractions that are 5 minutes apart or less.   Contractions that start on the top of the uterus and spread down to the lower abdomen and back.   A sense of increased pelvic pressure or back pain.   A watery or bloody mucus discharge that comes from the vagina.  If you have any of these signs before the 37th week of pregnancy, call your caregiver right away. You need to go to the hospital to get checked immediately.  HOME CARE INSTRUCTIONS    Avoid all  smoking, herbs, alcohol, and unprescribed drugs. These chemicals affect the formation and growth of the baby.   Follow your caregiver's instructions regarding medicine use. There are medicines that are either safe or unsafe to take during pregnancy.   Exercise only as directed by your caregiver. Experiencing uterine cramps is a good sign to stop exercising.   Continue to eat regular, healthy meals.   Wear a good support bra for breast tenderness.   Do not use hot tubs, steam rooms, or saunas.   Wear your seat belt at all times when driving.   Avoid raw meat, uncooked cheese, cat litter boxes, and soil used by cats. These carry germs that can cause birth defects in the baby.   Take your prenatal vitamins.   Try taking a stool softener (if your caregiver approves) if you develop constipation. Eat more high-fiber foods, such as fresh vegetables or fruit and whole grains. Drink plenty of fluids to keep your urine clear or pale yellow.   Take warm sitz baths to soothe any pain or discomfort caused by hemorrhoids. Use hemorrhoid cream if your caregiver approves.   If you develop varicose veins, wear support hose. Elevate your feet for 15 minutes, 3 4 times a day. Limit salt in your diet.   Avoid heavy lifting, wear low heal shoes, and practice good posture.   Rest a lot with your legs elevated if you have leg cramps or low back pain.   Visit your dentist if you have not gone during your pregnancy. Use a soft toothbrush to brush your teeth and be gentle when you floss.   A sexual relationship may be continued unless your caregiver directs you otherwise.   Do not travel far distances unless it is absolutely necessary and only with the approval of your caregiver.   Take prenatal classes to understand, practice, and ask questions about the labor and delivery.   Make a trial run to the hospital.   Pack your hospital bag.   Prepare the baby's nursery.   Continue to go to all your prenatal visits as directed  by your caregiver.  SEEK MEDICAL CARE IF:   You are unsure if you are in labor or if your water has broken.   You have dizziness.   You have mild pelvic cramps, pelvic pressure, or nagging pain in your abdominal area.   You have persistent nausea, vomiting, or diarrhea.   You have a bad smelling vaginal discharge.   You have pain with urination.  SEEK IMMEDIATE MEDICAL CARE IF:    You have a fever.   You are leaking fluid from your vagina.   You have spotting or bleeding from your vagina.     You have severe abdominal cramping or pain.   You have rapid weight loss or gain.   You have shortness of breath with chest pain.   You notice sudden or extreme swelling of your face, hands, ankles, feet, or legs.   You have not felt your baby move in over an hour.   You have severe headaches that do not go away with medicine.   You have vision changes.  Document Released: 04/26/2001 Document Revised: 01/02/2013 Document Reviewed: 07/03/2012  [employer] Patient Information 2014 [employer]

## 2013-10-21 NOTE — MAU Provider Note (Signed)
Chief Complaint:  Decreased Fetal Movement   Yanellie Vozar is a 18 y.o.  G1P0 with IUP at [redacted]w[redacted]d presenting for Decreased Fetal Movement She reports decreased fetal movements since Sunday. She tried laying down, drinking water and eating food. None of these increased fetal movement. She denies any leakage of fluid or vaginal bleeding. She denies any fever, nausea or vomiting. She does have swelling of her b/l LE. She denies any RUQ pain or headaches.   Menstrual History: OB History   Grav Para Term Preterm Abortions TAB SAB Ect Mult Living   1               Patient's last menstrual period was 02/28/2013.      Past Medical History  Diagnosis Date  . Renal disorder   . Swelling   . Bladder infection, chronic   . Supervision of normal pregnancy in first trimester 05/03/2013    Past Surgical History  Procedure Laterality Date  . Tonsillectomy      Family History  Problem Relation Age of Onset  . Diabetes Paternal Grandfather   . Heart disease Paternal Grandfather   . Diabetes Paternal Grandmother   . Heart disease Paternal Grandmother   . Diabetes Maternal Grandmother   . Heart disease Maternal Grandmother   . Diabetes Maternal Grandfather   . Heart disease Maternal Grandfather   . Heart disease Mother     History  Substance Use Topics  . Smoking status: Former Smoker -- 0.50 packs/day for 2 years    Types: Cigarettes    Quit date: 04/04/2013  . Smokeless tobacco: Former Neurosurgeon    Types: Chew  . Alcohol Use: No      Allergies  Allergen Reactions  . Aleve [Naproxen Sodium] Hives  . Amoxicillin Hives    Able to take Keflex.  . Tylenol [Acetaminophen] Hives    Prescriptions prior to admission  Medication Sig Dispense Refill  . calcium carbonate (TUMS - DOSED IN MG ELEMENTAL CALCIUM) 500 MG chewable tablet Chew 1 tablet by mouth daily as needed for indigestion or heartburn.      . diphenhydrAMINE (BENADRYL) 25 mg capsule Take 50 mg by mouth every 6 (six) hours as  needed. Allergies      . flintstones complete (FLINTSTONES) 60 MG chewable tablet Chew 1 tablet by mouth daily.      . ondansetron (ZOFRAN) 4 MG tablet Take 1 tablet (4 mg total) by mouth every 6 (six) hours.  30 tablet  4  . Prenatal Vit-Fe Fumarate-FA (PRENATAL MULTIVITAMIN) TABS tablet Take 1 tablet by mouth daily at 12 noon.      Marland Kitchen EPINEPHrine (EPI-PEN) 0.3 mg/0.3 mL DEVI Inject 0.3 mLs (0.3 mg total) into the muscle once.  1 Device  1    Review of Systems - Negative except for what is mentioned in HPI.  Physical Exam  Blood pressure 126/67, pulse 102, resp. rate 18, height 5' 7.25" (1.708 m), weight 140.706 kg (310 lb 3.2 oz), last menstrual period 02/28/2013. GENERAL: Well-developed, well-nourished female in no acute distress.  LUNGS: Clear to auscultation bilaterally.  HEART: Regular rate and rhythm. ABDOMEN: Soft, nontender, nondistended, gravid.  EXTREMITIES: Nontender, no edema, 2+ distal pulses.   FHT:  Baseline rate 150 bpm   Variability moderate  Accelerations present   Decelerations none Contractions: none   Labs: No results found for this or any previous visit (from the past 24 hour(s)).  Imaging Studies:  No results found.  Assessment: Nakoma Brownback is  18  y.o. G1P0 at 2957w4d presents with Decreased Fetal Movement .  Plan: Discharge to home.  #Decreased fetal movements:  Mother reports that fetus has been moving appropriately since arriving in the MAU. Fetal strip has been reactive and reassuring. No ctx and no LOF or VB.  - f/u as scheduled.   Myra RudeJeremy E Geron Mulford 6/9/201512:03 AM

## 2013-10-23 NOTE — MAU Provider Note (Signed)
Attestation of Attending Supervision of Advanced Practitioner (CNM/NP): Evaluation and management procedures were performed by the Advanced Practitioner under my supervision and collaboration.  I have reviewed the Advanced Practitioner's note and chart, and I agree with the management and plan.  Susumu Hackler 10/23/2013 4:06 PM

## 2013-10-24 ENCOUNTER — Encounter: Payer: Self-pay | Admitting: Advanced Practice Midwife

## 2013-10-24 ENCOUNTER — Ambulatory Visit (INDEPENDENT_AMBULATORY_CARE_PROVIDER_SITE_OTHER): Payer: Self-pay | Admitting: Advanced Practice Midwife

## 2013-10-24 VITALS — BP 140/68 | Wt 308.5 lb

## 2013-10-24 DIAGNOSIS — O26849 Uterine size-date discrepancy, unspecified trimester: Secondary | ICD-10-CM

## 2013-10-24 DIAGNOSIS — Z331 Pregnant state, incidental: Secondary | ICD-10-CM

## 2013-10-24 DIAGNOSIS — Z1389 Encounter for screening for other disorder: Secondary | ICD-10-CM

## 2013-10-24 LAB — POCT URINALYSIS DIPSTICK
Glucose, UA: NEGATIVE
Ketones, UA: NEGATIVE
NITRITE UA: NEGATIVE
PROTEIN UA: NEGATIVE

## 2013-10-24 NOTE — Patient Instructions (Signed)
Hypertension During Pregnancy Hypertension is also called high blood pressure. It can occur at any time in life and during pregnancy. When you have hypertension, there is extra pressure inside your blood vessels that carry blood from the heart to the rest of your body (arteries). Hypertension during pregnancy can cause problems for you and your baby. Your baby might not weigh as much as it should at birth or might be born early (premature). Very bad cases of hypertension during pregnancy can be life threatening.  Different types of hypertension can occur during pregnancy.   Chronic hypertension. This happens when a woman has hypertension before pregnancy and it continues during pregnancy.  Gestational hypertension. This is when hypertension develops during pregnancy.  Preeclampsia or toxemia of pregnancy. This is a very serious type of hypertension that develops only during pregnancy. It is a disease that affects the whole body (systemic) and can be very dangerous for both mother and baby.  Gestational hypertension and preeclampsia usually go away after your baby is born. Blood pressure generally stabilizes within 6 weeks. Women who have hypertension during pregnancy have a greater chance of developing hypertension later in life or with future pregnancies. RISK FACTORS Some factors make you more likely to develop hypertension during pregnancy. Risk factors include:  Having hypertension before pregnancy.  Having hypertension during a previous pregnancy.  Being overweight.  Being older than 40.  Being pregnant with more than one baby (multiples).  Having diabetes or kidney problems. SIGNS AND SYMPTOMS Chronic and gestational hypertension rarely cause symptoms. Preeclampsia has symptoms, which may include:  Increased protein in your urine. Your health care provider will check for this at every prenatal visit.  Swelling of your hands and face.  Rapid weight gain.  Headaches.  Visual  changes.  Being bothered by light.  Abdominal pain, especially in the right upper area.  Chest pain.  Shortness of breath.  Increased reflexes.  Seizures. Seizures occur with a more severe form of preeclampsia, called eclampsia. DIAGNOSIS   You may be diagnosed with hypertension during a regular prenatal exam. At each visit, tests may include:  Blood pressure checks.  A urine test to check for protein in your urine.  The type of hypertension you are diagnosed with depends on when you developed it. It also depends on your specific blood pressure reading.  Developing hypertension before 20 weeks of pregnancy is consistent with chronic hypertension.  Developing hypertension after 20 weeks of pregnancy is consistent with gestational hypertension.  Hypertension with increased urinary protein is diagnosed as preeclampsia.  Blood pressure measurements that stay above 160 systolic or 110 diastolic are a sign of severe preeclampsia. TREATMENT Treatment for hypertension during pregnancy varies. Treatment depends on the type of hypertension and how serious it is.  If you take medicine for chronic hypertension, you may need to switch medicines.  Drugs called ACE inhibitors should not be taken during pregnancy.  Low-dose aspirin may be suggested for women who have risk factors for preeclampsia.  If you have gestational hypertension, you may need to take a blood pressure medicine that is safe during pregnancy. Your health care provider will recommend the appropriate medicine.  If you have severe preeclampsia, you may need to be in the hospital. Health care providers will watch you and the baby very closely. You also may need to take medicine (magnesium sulfate) to prevent seizures and lower blood pressure.  Sometimes an early delivery is needed. This may be the case if the condition worsens. It would   be done to protect you and the baby. The only cure for preeclampsia is delivery. HOME  CARE INSTRUCTIONS  Schedule and keep all of your regular appointments for prenatal care.  Only take over-the-counter or prescription medicines as directed by your health care provider. Tell your health care provider about all medicines you take.  Eat as little salt as possible.  Get regular exercise.  Do not drink alcohol.  Do not use tobacco products.  Do not drink products with caffeine.  Lie on your left side when resting. SEEK IMMEDIATE MEDICAL CARE IF:  You have severe abdominal pain.  You have sudden swelling in the hands, ankles, or face.  You gain 4 pounds (1.8 kg) or more in 1 week.  You vomit repeatedly.  You have vaginal bleeding.  You do not feel the baby moving as much.  You have a headache.  You have blurred or double vision.  You have muscle twitching or spasms.  You have shortness of breath.  You have blue fingernails and lips.  You have blood in your urine. MAKE SURE YOU:  Understand these instructions.  Will watch your condition.  Will get help right away if you are not doing well or get worse. Document Released: 01/18/2011 Document Revised: 02/20/2013 Document Reviewed: 11/29/2012 Exit[employer] Patient Information 2014 [employer].  

## 2013-10-24 NOTE — Progress Notes (Signed)
G1P0 110w0d Estimated Date of Delivery: 12/05/13  Blood pressure 140/68, weight 308 lb 8 oz (139.935 kg), last menstrual period 02/28/2013.   BP weight and urine results all reviewed and noted. SBP elevated.  Denies HA, vision changes, RUQ pain.  DTR's 2+.  Please refer to the obstetrical flow sheet for the fundal height and fetal heart rate documentation:  Size>dates.  Will check EFW  Patient reports good fetal movement, denies any bleeding and no rupture of membranes symptoms or regular contractions. Patient is without complaints. All questions were answered.  Plan:  Continued routine obstetrical care, warning s/s for preeclampsia given  Follow up in 1 weeks for OB appointment, to keep eye on BP

## 2013-10-30 ENCOUNTER — Encounter: Payer: Self-pay | Admitting: Obstetrics and Gynecology

## 2013-10-30 ENCOUNTER — Ambulatory Visit (INDEPENDENT_AMBULATORY_CARE_PROVIDER_SITE_OTHER): Payer: Self-pay | Admitting: Obstetrics and Gynecology

## 2013-10-30 VITALS — BP 120/72 | Wt 308.0 lb

## 2013-10-30 DIAGNOSIS — Z3400 Encounter for supervision of normal first pregnancy, unspecified trimester: Secondary | ICD-10-CM

## 2013-10-30 DIAGNOSIS — K130 Diseases of lips: Secondary | ICD-10-CM

## 2013-10-30 DIAGNOSIS — Z1389 Encounter for screening for other disorder: Secondary | ICD-10-CM

## 2013-10-30 DIAGNOSIS — Z331 Pregnant state, incidental: Secondary | ICD-10-CM

## 2013-10-30 LAB — POCT URINALYSIS DIPSTICK
GLUCOSE UA: NEGATIVE
Nitrite, UA: NEGATIVE

## 2013-10-30 MED ORDER — MICONAZOLE NITRATE 2 % VA CREA: 1.0000 | TOPICAL_CREAM | Freq: Every day | VAGINAL | Status: DC

## 2013-10-30 MED ORDER — FLUCONAZOLE 150 MG PO TABS: 150.0000 mg | ORAL_TABLET | Freq: Once | ORAL | Status: DC

## 2013-10-30 NOTE — Progress Notes (Signed)
G1P0 3868w6d Estimated Date of Delivery: 12/05/13  Blood pressure 120/72, weight 308 lb (139.708 kg), last menstrual period 02/28/2013.   BP weight and urine results all reviewed and noted.  Please refer to the obstetrical flow sheet for the fundal height and fetal heart rate documentation:  Patient reports good fetal movement, denies any bleeding and no rupture of membranes symptoms or regular contractions. Patient complains of a rash between her thighs. She has tried Desitin without relief.   Assessment: Intertrigo  Plan:  Continued routine obstetrical care, Monistat to apply to between thighs and take Diflucan.   [facility]

## 2013-10-30 NOTE — Patient Instructions (Signed)
Intertrigo Intertrigo is a skin condition that occurs in between folds of skin in places on the body that rub together a lot and do not get much ventilation. It is caused by heat, moisture, friction, sweat retention, and lack of air circulation, which produces red, irritated patches and, sometimes, scaling or drainage. People who have diabetes, who are obese, or who have treatment with antibiotics are at increased risk for intertrigo. The most common sites for intertrigo to occur include:  The groin.  The breasts.  The armpits.  Folds of abdominal skin.  Webbed spaces between the fingers or toes. Intertrigo may be aggravated by:  Sweat.  Feces.  Yeast or bacteria that are present near skin folds.  Urine.  Vaginal discharge. HOME CARE INSTRUCTIONS  The following steps can be taken to reduce friction and keep the affected area cool and dry:  Expose skin folds to the air.  Keep deep skin folds separated with cotton or linen cloth. Avoid tight fitting clothing that could cause chafing.  Wear open-toed shoes or sandals to help reduce moisture between the toes.  Apply absorbent powders to affected areas as directed by your caregiver.  Apply over-the-counter barrier pastes, such as zinc oxide, as directed by your caregiver.  If you develop a fungal infection in the affected area, your caregiver may have you use antifungal creams. SEEK MEDICAL CARE IF:   The rash is not improving after 1 week of treatment.  The rash is getting worse (more red, more swollen, more painful, or spreading).  You have a fever or chills. MAKE SURE YOU:   Understand these instructions.  Will watch your condition.  Will get help right away if you are not doing well or get worse. Document Released: 05/02/2005 Document Revised: 07/25/2011 Document Reviewed: 10/15/2009 Exit[employer] Patient Information 2015 [employer]. This information is not intended to replace advice given to you by your health  care provider. Make sure you discuss any questions you have with your health care provider.  

## 2013-10-30 NOTE — Progress Notes (Signed)
Pt here for routine visit. Rash on her inner thighs, had for 2 days, tired desitin and it made it worse.

## 2013-11-05 ENCOUNTER — Ambulatory Visit (INDEPENDENT_AMBULATORY_CARE_PROVIDER_SITE_OTHER): Payer: Medicaid Other | Admitting: Obstetrics and Gynecology

## 2013-11-05 ENCOUNTER — Telehealth: Payer: Self-pay | Admitting: Obstetrics and Gynecology

## 2013-11-05 ENCOUNTER — Telehealth: Payer: Self-pay | Admitting: Women's Health

## 2013-11-05 ENCOUNTER — Encounter: Payer: Self-pay | Admitting: Obstetrics and Gynecology

## 2013-11-05 VITALS — BP 120/80

## 2013-11-05 DIAGNOSIS — Z331 Pregnant state, incidental: Secondary | ICD-10-CM

## 2013-11-05 DIAGNOSIS — O368131 Decreased fetal movements, third trimester, fetus 1: Secondary | ICD-10-CM

## 2013-11-05 DIAGNOSIS — O368190 Decreased fetal movements, unspecified trimester, not applicable or unspecified: Secondary | ICD-10-CM

## 2013-11-05 DIAGNOSIS — Z1389 Encounter for screening for other disorder: Secondary | ICD-10-CM

## 2013-11-05 LAB — POCT URINALYSIS DIPSTICK
Blood, UA: NEGATIVE
Glucose, UA: NEGATIVE
Ketones, UA: NEGATIVE
LEUKOCYTES UA: NEGATIVE
Nitrite, UA: NEGATIVE

## 2013-11-05 NOTE — Patient Instructions (Signed)

## 2013-11-05 NOTE — Telephone Encounter (Signed)
Pt states feeling the baby move but does not seem to be as much. Encouraged pt to do Kick count, eat and drink something high in sugar, 10 times in 2 hours and call office back. Pt verbalized understanding.

## 2013-11-05 NOTE — Progress Notes (Signed)
Pt states that she has had decreased fetal movement all day today. Pt did kick counts, ate and drank and movement has not changed.

## 2013-11-05 NOTE — Progress Notes (Signed)
CC: Decr FM, only 3-4 /2hr monitoring at home this morning. [facility] Was normal at that ck. NST clearly reactive. No contraction  No rom.

## 2013-11-05 NOTE — Telephone Encounter (Signed)
Pt states only felt the baby move 1-2 within the 2 hour kick count. Pt to come in now for evaluation.

## 2013-11-12 ENCOUNTER — Inpatient Hospital Stay (HOSPITAL_COMMUNITY)
Admission: AD | Admit: 2013-11-12 | Discharge: 2013-11-12 | Disposition: A | Discharge status: Home / Self Care | Payer: Medicaid Other | Source: Ambulatory Visit | Attending: Obstetrics & Gynecology | Admitting: Obstetrics & Gynecology

## 2013-11-12 ENCOUNTER — Encounter (HOSPITAL_COMMUNITY): Payer: Self-pay | Admitting: *Deleted

## 2013-11-12 DIAGNOSIS — R030 Elevated blood-pressure reading, without diagnosis of hypertension: Secondary | ICD-10-CM

## 2013-11-12 DIAGNOSIS — O99891 Other specified diseases and conditions complicating pregnancy: Secondary | ICD-10-CM | POA: Insufficient documentation

## 2013-11-12 DIAGNOSIS — O10019 Pre-existing essential hypertension complicating pregnancy, unspecified trimester: Secondary | ICD-10-CM | POA: Insufficient documentation

## 2013-11-12 DIAGNOSIS — O26899 Other specified pregnancy related conditions, unspecified trimester: Secondary | ICD-10-CM

## 2013-11-12 DIAGNOSIS — IMO0001 Reserved for inherently not codable concepts without codable children: Secondary | ICD-10-CM

## 2013-11-12 DIAGNOSIS — O9989 Other specified diseases and conditions complicating pregnancy, childbirth and the puerperium: Principal | ICD-10-CM

## 2013-11-12 DIAGNOSIS — O479 False labor, unspecified: Secondary | ICD-10-CM

## 2013-11-12 DIAGNOSIS — Z833 Family history of diabetes mellitus: Secondary | ICD-10-CM | POA: Insufficient documentation

## 2013-11-12 DIAGNOSIS — R109 Unspecified abdominal pain: Secondary | ICD-10-CM | POA: Insufficient documentation

## 2013-11-12 DIAGNOSIS — R1084 Generalized abdominal pain: Secondary | ICD-10-CM

## 2013-11-12 DIAGNOSIS — K219 Gastro-esophageal reflux disease without esophagitis: Secondary | ICD-10-CM | POA: Insufficient documentation

## 2013-11-12 DIAGNOSIS — Z87891 Personal history of nicotine dependence: Secondary | ICD-10-CM | POA: Insufficient documentation

## 2013-11-12 HISTORY — DX: Essential (primary) hypertension: I10

## 2013-11-12 HISTORY — DX: Gastro-esophageal reflux disease without esophagitis: K21.9

## 2013-11-12 NOTE — MAU Note (Signed)
PT SAYS SHE STARTED HURTING BAD AT 2300.  SHE FELT SHARP PAIN -  LAYED DOWN  AND PAINS  HAVE CONTINUED TO COME.   [facility]  [facility] Thursday- = VE-  CLOSED.    DENIES HSV AND MRSA.

## 2013-11-12 NOTE — MAU Provider Note (Signed)
History     CSN: [id number]  Arrival date and time: 11/12/13 0015   First Provider Initiated Contact with Patient 11/12/13 0149      No chief complaint on file.  HPI  Suzanne Short is a 18 y.o. G1P0 at 1876w5d who presents today with sudden onset of sharp upper abdominal pain. Since being here she states that the pain has subsided. She states that on the way over her pain was 7/10, but now she states that it is 1/10. She denies any UC, VB or LOF. She states that the baby is moving normally.   Past Medical History  Diagnosis Date  . Swelling   . Bladder infection, chronic   . Supervision of normal pregnancy in first trimester 05/03/2013  . Renal disorder   . GERD (gastroesophageal reflux disease)   . Hypertension     Past Surgical History  Procedure Laterality Date  . Tonsillectomy      Family History  Problem Relation Age of Onset  . Diabetes Paternal Grandfather   . Heart disease Paternal Grandfather   . Diabetes Paternal Grandmother   . Heart disease Paternal Grandmother   . Diabetes Maternal Grandmother   . Heart disease Maternal Grandmother   . Diabetes Maternal Grandfather   . Heart disease Maternal Grandfather   . Heart disease Mother     History  Substance Use Topics  . Smoking status: Former Smoker -- 0.50 packs/day for 2 years    Types: Cigarettes    Quit date: 04/04/2013  . Smokeless tobacco: Former NeurosurgeonUser    Types: Chew  . Alcohol Use: No    Allergies:  Allergies  Allergen Reactions  . Aleve [Naproxen Sodium] Hives  . Amoxicillin Hives    Able to take Keflex.  . Tylenol [Acetaminophen] Hives    Prescriptions prior to admission  Medication Sig Dispense Refill  . calcium carbonate (TUMS - DOSED IN MG ELEMENTAL CALCIUM) 500 MG chewable tablet Chew 1 tablet by mouth daily as needed for indigestion or heartburn.      . diphenhydrAMINE (BENADRYL) 25 mg capsule Take 50 mg by mouth every 6 (six) hours as needed. Allergies      . flintstones complete  (FLINTSTONES) 60 MG chewable tablet Chew 1 tablet by mouth daily.      . fluconazole (DIFLUCAN) 150 MG tablet Take 1 tablet (150 mg total) by mouth once.  4 tablet  1  . miconazole (MONISTAT 7) 2 % vaginal cream Place 1 Applicatorful vaginally at bedtime.  45 g  1  . ondansetron (ZOFRAN) 4 MG tablet Take 1 tablet (4 mg total) by mouth every 6 (six) hours.  30 tablet  4  . Prenatal Vit-Fe Fumarate-FA (PRENATAL MULTIVITAMIN) TABS tablet Take 1 tablet by mouth daily at 12 noon.      Marland Kitchen. EPINEPHrine (EPI-PEN) 0.3 mg/0.3 mL DEVI Inject 0.3 mLs (0.3 mg total) into the muscle once.  1 Device  1    ROS Physical Exam   Blood pressure 137/78, pulse 112, temperature 97.9 F (36.6 C), temperature source Oral, resp. rate 18, height 5\' 7"  (1.702 m), weight 142.033 kg (313 lb 2 oz), last menstrual period 02/28/2013.  Physical Exam  Nursing note and vitals reviewed. Constitutional: She is oriented to person, place, and time. She appears well-developed and well-nourished. No distress.  Cardiovascular: Normal rate.   Respiratory: Effort normal.  GI: Soft. There is no tenderness. There is no rebound.  Neurological: She is alert and oriented to person, place, and time.  Skin: Skin is warm and dry.  Psychiatric: She has a normal mood and affect.    MAU Course  Procedures  FHT: 140, moderate with 15x 15 accels, no decels Toco: No UCs  Assessment and Plan   Abdominal pain in pregnancy Labor precautions  Fetal kick counts Return to MAU as needed  Follow-up Information   [facility]   Specialty:  Obstetrics and Gynecology   Contact information:   [address] [place] [id number] [phone]       Tawnya CrookHogan, Heather Donovan 11/12/2013, 2:29 AM

## 2013-11-12 NOTE — Discharge Instructions (Signed)
Third Trimester of Pregnancy The third trimester is from week 29 through week 42, months 7 through 9. The third trimester is a time when the fetus is growing rapidly. At the end of the ninth month, the fetus is about 20 inches in length and weighs 6-10 pounds.  BODY CHANGES Your body goes through many changes during pregnancy. The changes vary from woman to woman.   Your weight will continue to increase. You can expect to gain 25-35 pounds (11-16 kg) by the end of the pregnancy.  You may begin to get stretch marks on your hips, abdomen, and breasts.  You may urinate more often because the fetus is moving lower into your pelvis and pressing on your bladder.  You may develop or continue to have heartburn as a result of your pregnancy.  You may develop constipation because certain hormones are causing the muscles that push waste through your intestines to slow down.  You may develop hemorrhoids or swollen, bulging veins (varicose veins).  You may have pelvic pain because of the weight gain and pregnancy hormones relaxing your joints between the bones in your pelvis. Backaches may result from overexertion of the muscles supporting your posture.  You may have changes in your hair. These can include thickening of your hair, rapid growth, and changes in texture. Some women also have hair loss during or after pregnancy, or hair that feels dry or thin. Your hair will most likely return to normal after your baby is born.  Your breasts will continue to grow and be tender. A yellow discharge may leak from your breasts called colostrum.  Your belly button may stick out.  You may feel short of breath because of your expanding uterus.  You may notice the fetus "dropping," or moving lower in your abdomen.  You may have a bloody mucus discharge. This usually occurs a few days to a week before labor begins.  Your cervix becomes thin and soft (effaced) near your due date. WHAT TO EXPECT AT YOUR PRENATAL  EXAMS  You will have prenatal exams every 2 weeks until week 36. Then, you will have weekly prenatal exams. During a routine prenatal visit:  You will be weighed to make sure you and the fetus are growing normally.  Your blood pressure is taken.  Your abdomen will be measured to track your baby's growth.  The fetal heartbeat will be listened to.  Any test results from the previous visit will be discussed.  You may have a cervical check near your due date to see if you have effaced. At around 36 weeks, your caregiver will check your cervix. At the same time, your caregiver will also perform a test on the secretions of the vaginal tissue. This test is to determine if a type of bacteria, Group B streptococcus, is present. Your caregiver will explain this further. Your caregiver may ask you:  What your birth plan is.  How you are feeling.  If you are feeling the baby move.  If you have had any abnormal symptoms, such as leaking fluid, bleeding, severe headaches, or abdominal cramping.  If you have any questions. Other tests or screenings that may be performed during your third trimester include:  Blood tests that check for low iron levels (anemia).  Fetal testing to check the health, activity level, and growth of the fetus. Testing is done if you have certain medical conditions or if there are problems during the pregnancy. FALSE LABOR You may feel small, irregular contractions that   eventually go away. These are called Braxton Hicks contractions, or false labor. Contractions may last for hours, days, or even weeks before true labor sets in. If contractions come at regular intervals, intensify, or become painful, it is best to be seen by your caregiver.  SIGNS OF LABOR   Menstrual-like cramps.  Contractions that are 5 minutes apart or less.  Contractions that start on the top of the uterus and spread down to the lower abdomen and back.  A sense of increased pelvic pressure or back  pain.  A watery or bloody mucus discharge that comes from the vagina. If you have any of these signs before the 37th week of pregnancy, call your caregiver right away. You need to go to the hospital to get checked immediately. HOME CARE INSTRUCTIONS   Avoid all smoking, herbs, alcohol, and unprescribed drugs. These chemicals affect the formation and growth of the baby.  Follow your caregiver's instructions regarding medicine use. There are medicines that are either safe or unsafe to take during pregnancy.  Exercise only as directed by your caregiver. Experiencing uterine cramps is a good sign to stop exercising.  Continue to eat regular, healthy meals.  Wear a good support bra for breast tenderness.  Do not use hot tubs, steam rooms, or saunas.  Wear your seat belt at all times when driving.  Avoid raw meat, uncooked cheese, cat litter boxes, and soil used by cats. These carry germs that can cause birth defects in the baby.  Take your prenatal vitamins.  Try taking a stool softener (if your caregiver approves) if you develop constipation. Eat more high-fiber foods, such as fresh vegetables or fruit and whole grains. Drink plenty of fluids to keep your urine clear or pale yellow.  Take warm sitz baths to soothe any pain or discomfort caused by hemorrhoids. Use hemorrhoid cream if your caregiver approves.  If you develop varicose veins, wear support hose. Elevate your feet for 15 minutes, 3-4 times a day. Limit salt in your diet.  Avoid heavy lifting, wear low heal shoes, and practice good posture.  Rest a lot with your legs elevated if you have leg cramps or low back pain.  Visit your dentist if you have not gone during your pregnancy. Use a soft toothbrush to brush your teeth and be gentle when you floss.  A sexual relationship may be continued unless your caregiver directs you otherwise.  Do not travel far distances unless it is absolutely necessary and only with the approval  of your caregiver.  Take prenatal classes to understand, practice, and ask questions about the labor and delivery.  Make a trial run to the hospital.  Pack your hospital bag.  Prepare the baby's nursery.  Continue to go to all your prenatal visits as directed by your caregiver. SEEK MEDICAL CARE IF:  You are unsure if you are in labor or if your water has broken.  You have dizziness.  You have mild pelvic cramps, pelvic pressure, or nagging pain in your abdominal area.  You have persistent nausea, vomiting, or diarrhea.  You have a bad smelling vaginal discharge.  You have pain with urination. SEEK IMMEDIATE MEDICAL CARE IF:   You have a fever.  You are leaking fluid from your vagina.  You have spotting or bleeding from your vagina.  You have severe abdominal cramping or pain.  You have rapid weight loss or gain.  You have shortness of breath with chest pain.  You notice sudden or extreme swelling   of your face, hands, ankles, feet, or legs.  You have not felt your baby move in over an hour.  You have severe headaches that do not go away with medicine.  You have vision changes. Document Released: 04/26/2001 Document Revised: 05/07/2013 Document Reviewed: 07/03/2012 [employer] Patient Information 2015 [employer]. This information is not intended to replace advice given to you by your health care provider. Make sure you discuss any questions you have with your health care provider.  

## 2013-11-13 ENCOUNTER — Encounter: Payer: Medicaid Other | Admitting: Women's Health

## 2013-11-14 ENCOUNTER — Ambulatory Visit (INDEPENDENT_AMBULATORY_CARE_PROVIDER_SITE_OTHER): Payer: Medicaid Other | Admitting: Obstetrics & Gynecology

## 2013-11-14 ENCOUNTER — Encounter: Payer: Self-pay | Admitting: Obstetrics & Gynecology

## 2013-11-14 ENCOUNTER — Ambulatory Visit (INDEPENDENT_AMBULATORY_CARE_PROVIDER_SITE_OTHER): Payer: Medicaid Other

## 2013-11-14 ENCOUNTER — Other Ambulatory Visit: Payer: Self-pay | Admitting: Advanced Practice Midwife

## 2013-11-14 VITALS — BP 130/70 | Wt 310.0 lb

## 2013-11-14 DIAGNOSIS — O26849 Uterine size-date discrepancy, unspecified trimester: Secondary | ICD-10-CM

## 2013-11-14 DIAGNOSIS — Z331 Pregnant state, incidental: Secondary | ICD-10-CM

## 2013-11-14 DIAGNOSIS — Z3403 Encounter for supervision of normal first pregnancy, third trimester: Secondary | ICD-10-CM

## 2013-11-14 DIAGNOSIS — R81 Glycosuria: Secondary | ICD-10-CM

## 2013-11-14 DIAGNOSIS — Z1389 Encounter for screening for other disorder: Secondary | ICD-10-CM

## 2013-11-14 DIAGNOSIS — Z3400 Encounter for supervision of normal first pregnancy, unspecified trimester: Secondary | ICD-10-CM

## 2013-11-14 DIAGNOSIS — Z131 Encounter for screening for diabetes mellitus: Secondary | ICD-10-CM

## 2013-11-14 LAB — POCT URINALYSIS DIPSTICK
GLUCOSE UA: 4
Ketones, UA: NEGATIVE
LEUKOCYTES UA: NEGATIVE
NITRITE UA: NEGATIVE
Protein, UA: NEGATIVE
RBC UA: NEGATIVE

## 2013-11-14 LAB — GLUCOSE, POCT (MANUAL RESULT ENTRY): POC GLUCOSE: 354 mg/dL — AB (ref 70–99)

## 2013-11-14 LAB — OB RESULTS CONSOLE GBS: GBS: POSITIVE

## 2013-11-14 LAB — OB RESULTS CONSOLE GC/CHLAMYDIA
Chlamydia: NEGATIVE
Gonorrhea: NEGATIVE

## 2013-11-14 NOTE — Progress Notes (Signed)
U/S(37+0wks)-vtx active fetus, EFW 7 lb 11 oz (80th%tile), fluid WNL AFI-17.3cm SDP-6.1cm, FHR-158 bpm, anterior Gr 2 placenta, female fetus

## 2013-11-14 NOTE — Progress Notes (Signed)
Sonogram is reviewed and report done GBS performed  G1P0 2224w0d Estimated Date of Delivery: 12/05/13  Blood pressure 130/70, weight 310 lb (140.615 kg), last menstrual period 02/28/2013.   BP weight and urine results all reviewed and noted.  Pt had french toast, apple juice and orange juice for breakfast  Please refer to the obstetrical flow sheet for the fundal height and fetal heart rate documentation:  Patient reports good fetal movement, denies any bleeding and no rupture of membranes symptoms or regular contractions. Patient is without complaints. All questions were answered.  Plan:  Continued routine obstetrical care,   Follow up in 1 weeks for OB appointment, routine

## 2013-11-18 LAB — GC/CHLAMYDIA PROBE AMP
CT PROBE, AMP APTIMA: NEGATIVE
GC PROBE AMP APTIMA: NEGATIVE

## 2013-11-18 LAB — CULTURE, BETA STREP (GROUP B ONLY)

## 2013-11-21 ENCOUNTER — Encounter: Payer: Self-pay | Admitting: Obstetrics & Gynecology

## 2013-11-21 ENCOUNTER — Ambulatory Visit (INDEPENDENT_AMBULATORY_CARE_PROVIDER_SITE_OTHER): Payer: Self-pay | Admitting: Obstetrics & Gynecology

## 2013-11-21 VITALS — BP 150/80 | Wt 319.0 lb

## 2013-11-21 DIAGNOSIS — Z3400 Encounter for supervision of normal first pregnancy, unspecified trimester: Secondary | ICD-10-CM

## 2013-11-21 DIAGNOSIS — Z3403 Encounter for supervision of normal first pregnancy, third trimester: Secondary | ICD-10-CM

## 2013-11-21 MED ORDER — OMEPRAZOLE 20 MG PO CPDR: 20.0000 mg | DELAYED_RELEASE_CAPSULE | Freq: Every day | ORAL | Status: DC

## 2013-11-21 NOTE — Addendum Note (Signed)
Addended by: Lazaro ArmsEURE, Arnetha Silverthorne H on: 11/21/2013 12:16 PM   Modules accepted: Orders

## 2013-11-21 NOTE — Progress Notes (Signed)
G1P0 8568w0d Estimated Date of Delivery: 12/05/13  Blood pressure 150/80, weight 319 lb (144.697 kg), last menstrual period 02/28/2013.   BP weight and urine results all reviewed and noted.  Please refer to the obstetrical flow sheet for the fundal height and fetal heart rate documentation:  Patient reports good fetal movement, denies any bleeding and no rupture of membranes symptoms or regular contractions. Patient is without complaints. All questions were answered.  Plan:  Continued routine obstetrical care,   Follow up in 1 weeks for OB appointment, routine

## 2013-11-28 ENCOUNTER — Encounter: Payer: Self-pay | Admitting: Obstetrics & Gynecology

## 2013-11-28 ENCOUNTER — Ambulatory Visit (INDEPENDENT_AMBULATORY_CARE_PROVIDER_SITE_OTHER): Payer: Self-pay | Admitting: Obstetrics & Gynecology

## 2013-11-28 VITALS — BP 130/80 | Wt 324.0 lb

## 2013-11-28 DIAGNOSIS — Z3400 Encounter for supervision of normal first pregnancy, unspecified trimester: Secondary | ICD-10-CM

## 2013-11-28 DIAGNOSIS — Z3403 Encounter for supervision of normal first pregnancy, third trimester: Secondary | ICD-10-CM

## 2013-11-28 NOTE — Progress Notes (Signed)
G1P0 4360w0d Estimated Date of Delivery: 12/05/13  Blood pressure 130/80, weight 324 lb (146.965 kg), last menstrual period 02/28/2013.   BP weight and urine results all reviewed and noted.  Please refer to the obstetrical flow sheet for the fundal height and fetal heart rate documentation:  Patient reports good fetal movement, denies any bleeding and no rupture of membranes symptoms or regular contractions. Patient is without complaints. All questions were answered.  Plan:  Continued routine obstetrical care,   Follow up in 1 weeks for OB appointment, routine

## 2013-12-05 ENCOUNTER — Ambulatory Visit (INDEPENDENT_AMBULATORY_CARE_PROVIDER_SITE_OTHER): Payer: Self-pay | Admitting: Obstetrics & Gynecology

## 2013-12-05 ENCOUNTER — Encounter: Payer: Medicaid Other | Admitting: Obstetrics & Gynecology

## 2013-12-05 VITALS — BP 130/60 | Wt 327.0 lb

## 2013-12-05 DIAGNOSIS — Z331 Pregnant state, incidental: Secondary | ICD-10-CM

## 2013-12-05 DIAGNOSIS — Z3400 Encounter for supervision of normal first pregnancy, unspecified trimester: Secondary | ICD-10-CM

## 2013-12-05 DIAGNOSIS — Z3403 Encounter for supervision of normal first pregnancy, third trimester: Secondary | ICD-10-CM

## 2013-12-05 DIAGNOSIS — Z1389 Encounter for screening for other disorder: Secondary | ICD-10-CM

## 2013-12-05 LAB — POCT URINALYSIS DIPSTICK
Blood, UA: NEGATIVE
Ketones, UA: NEGATIVE
Nitrite, UA: NEGATIVE
PROTEIN UA: NEGATIVE

## 2013-12-05 NOTE — Progress Notes (Signed)
G1P0 4771w0d Estimated Date of Delivery: 12/05/13  Blood pressure 130/60, weight 327 lb (148.326 kg), last menstrual period 02/28/2013.   BP weight and urine results all reviewed and noted.  Please refer to the obstetrical flow sheet for the fundal height and fetal heart rate documentation:  Patient reports good fetal movement, denies any bleeding and no rupture of membranes symptoms or regular contractions. Patient is without complaints. All questions were answered.  Plan:  Continued routine obstetrical care,   Follow up in 6 weeks for OB appointment, pp appt

## 2013-12-09 ENCOUNTER — Telehealth (HOSPITAL_COMMUNITY): Payer: Self-pay | Admitting: *Deleted

## 2013-12-09 NOTE — Telephone Encounter (Signed)
Preadmission screen  

## 2013-12-10 ENCOUNTER — Telehealth: Payer: Self-pay | Admitting: Women's Health

## 2013-12-10 ENCOUNTER — Ambulatory Visit (INDEPENDENT_AMBULATORY_CARE_PROVIDER_SITE_OTHER): Payer: Medicaid Other | Admitting: Obstetrics & Gynecology

## 2013-12-10 ENCOUNTER — Inpatient Hospital Stay (HOSPITAL_COMMUNITY)
Admission: AD | Admit: 2013-12-10 | Discharge: 2013-12-14 | DRG: 775 | Disposition: A | Discharge status: Home / Self Care | Payer: Medicaid Other | Source: Ambulatory Visit | Attending: Obstetrics and Gynecology | Admitting: Obstetrics and Gynecology

## 2013-12-10 ENCOUNTER — Encounter (HOSPITAL_COMMUNITY): Payer: Self-pay | Admitting: *Deleted

## 2013-12-10 VITALS — BP 164/90 | Wt 327.0 lb

## 2013-12-10 DIAGNOSIS — O99892 Other specified diseases and conditions complicating childbirth: Secondary | ICD-10-CM | POA: Diagnosis present

## 2013-12-10 DIAGNOSIS — K219 Gastro-esophageal reflux disease without esophagitis: Secondary | ICD-10-CM | POA: Diagnosis present

## 2013-12-10 DIAGNOSIS — E669 Obesity, unspecified: Secondary | ICD-10-CM | POA: Diagnosis present

## 2013-12-10 DIAGNOSIS — IMO0001 Reserved for inherently not codable concepts without codable children: Secondary | ICD-10-CM

## 2013-12-10 DIAGNOSIS — Z833 Family history of diabetes mellitus: Secondary | ICD-10-CM | POA: Diagnosis not present

## 2013-12-10 DIAGNOSIS — O99344 Other mental disorders complicating childbirth: Secondary | ICD-10-CM | POA: Diagnosis present

## 2013-12-10 DIAGNOSIS — F1210 Cannabis abuse, uncomplicated: Secondary | ICD-10-CM | POA: Diagnosis present

## 2013-12-10 DIAGNOSIS — Z87891 Personal history of nicotine dependence: Secondary | ICD-10-CM

## 2013-12-10 DIAGNOSIS — O139 Gestational [pregnancy-induced] hypertension without significant proteinuria, unspecified trimester: Secondary | ICD-10-CM | POA: Diagnosis present

## 2013-12-10 DIAGNOSIS — O480 Post-term pregnancy: Secondary | ICD-10-CM | POA: Diagnosis present

## 2013-12-10 DIAGNOSIS — Z22330 Carrier of Group B streptococcus: Secondary | ICD-10-CM

## 2013-12-10 DIAGNOSIS — O99214 Obesity complicating childbirth: Secondary | ICD-10-CM

## 2013-12-10 DIAGNOSIS — O3090 Multiple gestation, unspecified, unspecified trimester: Secondary | ICD-10-CM

## 2013-12-10 DIAGNOSIS — O9989 Other specified diseases and conditions complicating pregnancy, childbirth and the puerperium: Secondary | ICD-10-CM

## 2013-12-10 DIAGNOSIS — R030 Elevated blood-pressure reading, without diagnosis of hypertension: Secondary | ICD-10-CM

## 2013-12-10 DIAGNOSIS — O368131 Decreased fetal movements, third trimester, fetus 1: Secondary | ICD-10-CM

## 2013-12-10 DIAGNOSIS — O368190 Decreased fetal movements, unspecified trimester, not applicable or unspecified: Secondary | ICD-10-CM

## 2013-12-10 LAB — COMPREHENSIVE METABOLIC PANEL
ALT: 10 U/L (ref 0–35)
ANION GAP: 14 (ref 5–15)
AST: 16 U/L (ref 0–37)
Albumin: 2.5 g/dL — ABNORMAL LOW (ref 3.5–5.2)
Alkaline Phosphatase: 242 U/L — ABNORMAL HIGH (ref 39–117)
BILIRUBIN TOTAL: 0.2 mg/dL — AB (ref 0.3–1.2)
BUN: 7 mg/dL (ref 6–23)
CHLORIDE: 101 meq/L (ref 96–112)
CO2: 19 mEq/L (ref 19–32)
Calcium: 9.3 mg/dL (ref 8.4–10.5)
Creatinine, Ser: 0.57 mg/dL (ref 0.50–1.10)
GFR calc non Af Amer: >90 mL/min (ref >90)
GLUCOSE: 164 mg/dL — AB (ref 70–99)
Potassium: 4.3 mEq/L (ref 3.7–5.3)
Sodium: 134 mEq/L — ABNORMAL LOW (ref 137–147)
Total Protein: 6.1 g/dL (ref 6.0–8.3)

## 2013-12-10 LAB — CBC
HEMATOCRIT: 35.1 % — AB (ref 36.0–46.0)
Hemoglobin: 12 g/dL (ref 12.0–15.0)
MCH: 28.8 pg (ref 26.0–34.0)
MCHC: 34.2 g/dL (ref 30.0–36.0)
MCV: 84.2 fL (ref 78.0–100.0)
Platelets: 249 10*3/uL (ref 150–400)
RBC: 4.17 MIL/uL (ref 3.87–5.11)
RDW: 16.3 % — AB (ref 11.5–15.5)
WBC: 15.9 10*3/uL — AB (ref 4.0–10.5)

## 2013-12-10 LAB — PROTEIN / CREATININE RATIO, URINE
Creatinine, Urine: 195.55 mg/dL
Protein Creatinine Ratio: 0.26 — ABNORMAL HIGH (ref 0.00–0.15)
Total Protein, Urine: 51 mg/dL

## 2013-12-10 MED ORDER — ZOLPIDEM TARTRATE 5 MG PO TABS
5.0000 mg | ORAL_TABLET | Freq: Every evening | ORAL | Status: DC | PRN
  Administered 2013-12-12: 5 mg via ORAL
  Filled 2013-12-10: qty 1

## 2013-12-10 MED ORDER — CITRIC ACID-SODIUM CITRATE 334-500 MG/5ML PO SOLN: 30.0000 mL | ORAL | Status: DC | PRN

## 2013-12-10 MED ORDER — LACTATED RINGERS IV SOLN
INTRAVENOUS | Status: DC
  Administered 2013-12-10 – 2013-12-12 (×4): via INTRAVENOUS

## 2013-12-10 MED ORDER — CEFAZOLIN SODIUM-DEXTROSE 2-3 GM-% IV SOLR
2.0000 g | Freq: Once | INTRAVENOUS | Status: AC
  Administered 2013-12-11: 2 g via INTRAVENOUS
  Filled 2013-12-10: qty 50

## 2013-12-10 MED ORDER — PANTOPRAZOLE SODIUM 40 MG PO TBEC
40.0000 mg | DELAYED_RELEASE_TABLET | Freq: Every day | ORAL | Status: DC
  Administered 2013-12-11 – 2013-12-12 (×2): 40 mg via ORAL
  Filled 2013-12-10 (×2): qty 1

## 2013-12-10 MED ORDER — OXYTOCIN 40 UNITS IN LACTATED RINGERS INFUSION - SIMPLE MED
62.5000 mL/h | INTRAVENOUS | Status: DC
  Administered 2013-12-13: 999 mL/h via INTRAVENOUS

## 2013-12-10 MED ORDER — BUTORPHANOL TARTRATE 1 MG/ML IJ SOLN
1.0000 mg | INTRAMUSCULAR | Status: DC | PRN
  Administered 2013-12-12: 1 mg via INTRAVENOUS
  Filled 2013-12-10: qty 1

## 2013-12-10 MED ORDER — OXYTOCIN BOLUS FROM INFUSION: 500.0000 mL | INTRAVENOUS | Status: DC

## 2013-12-10 MED ORDER — CALCIUM CARBONATE ANTACID 500 MG PO CHEW
1.0000 | CHEWABLE_TABLET | Freq: Every day | ORAL | Status: DC | PRN
  Administered 2013-12-11 – 2013-12-12 (×2): 200 mg via ORAL
  Filled 2013-12-10 (×2): qty 1

## 2013-12-10 MED ORDER — CEFAZOLIN SODIUM 1-5 GM-% IV SOLN
1.0000 g | Freq: Three times a day (TID) | INTRAVENOUS | Status: DC
  Administered 2013-12-11 – 2013-12-12 (×5): 1 g via INTRAVENOUS
  Filled 2013-12-10 (×7): qty 50

## 2013-12-10 MED ORDER — LACTATED RINGERS IV SOLN
500.0000 mL | INTRAVENOUS | Status: DC | PRN
  Administered 2013-12-12: 500 mL via INTRAVENOUS
  Administered 2013-12-12: 300 mL via INTRAVENOUS

## 2013-12-10 MED ORDER — LIDOCAINE HCL (PF) 1 % IJ SOLN
30.0000 mL | INTRAMUSCULAR | Status: DC | PRN
  Filled 2013-12-10: qty 30

## 2013-12-10 MED ORDER — ONDANSETRON HCL 4 MG/2ML IJ SOLN
4.0000 mg | Freq: Four times a day (QID) | INTRAMUSCULAR | Status: DC | PRN
Start: 2013-12-10 — End: 2013-12-13
  Administered 2013-12-10 – 2013-12-12 (×2): 4 mg via INTRAVENOUS
  Filled 2013-12-10 (×2): qty 2

## 2013-12-10 NOTE — Telephone Encounter (Signed)
Appointment scheduled for today for pt to be evaluated for c/o dizziness, shortness of breath, decrease FM.

## 2013-12-10 NOTE — H&P (Signed)
Suzanne Short is a 18 y.o. female G1P0 at [redacted]w[redacted]d gestation presenting for induction of labor for gestational hypertension.  Maternal Medical History:  Reason for admission: Nausea.    Patient saw her prenatal provider this morning and was found to have a hypertensive reading of 150/90. She had previous isolated elevated BP during pregnancy but these appeared transient and improved. She has had mild headaches but nothing out of the ordinary. No contractions, no vaginal bleeding, no discharge. Positive fetal movement. No other complaints today.  OB History   Grav Para Term Preterm Abortions TAB SAB Ect Mult Living   1              Past Medical History  Diagnosis Date  . Swelling   . Bladder infection, chronic   . Supervision of normal pregnancy in first trimester 05/03/2013  . Renal disorder   . GERD (gastroesophageal reflux disease)   . Hypertension    Past Surgical History  Procedure Laterality Date  . Tonsillectomy     Family History: family history includes Diabetes in her maternal grandfather, maternal grandmother, paternal grandfather, and paternal grandmother; Heart disease in her maternal grandfather, maternal grandmother, mother, paternal grandfather, and paternal grandmother. Social History:  reports that she quit smoking about 8 months ago. Her smoking use included Cigarettes. She has a 1 pack-year smoking history. She has quit using smokeless tobacco. Her smokeless tobacco use included Chew. She reports that she does not drink alcohol or use illicit drugs.   Prenatal Transfer Tool  Maternal Diabetes: No Genetic Screening: Normal Maternal Ultrasounds/Referrals: Normal Fetal Ultrasounds or other Referrals:  None Maternal Substance Abuse:  No Significant Maternal Medications:  None Significant Maternal Lab Results:  None Other Comments:  None  Review of Systems  Constitutional: Negative for fever and chills.  HENT: Negative for tinnitus.   Eyes: Negative for blurred  vision and double vision.  Respiratory: Negative for cough and shortness of breath.   Cardiovascular: Negative for chest pain and palpitations.  Gastrointestinal: Negative for heartburn, nausea, vomiting, abdominal pain and diarrhea.  Genitourinary: Negative for dysuria and urgency.  Musculoskeletal: Negative for myalgias.  Skin: Negative for rash.  Neurological: Positive for headaches. Negative for dizziness and tremors.  Psychiatric/Behavioral: Negative for depression. The patient is nervous/anxious.       Blood pressure 135/80, pulse 110, temperature 98.4 F (36.9 C), temperature source Oral, resp. rate 18, height 5\' 7"  (1.702 m), weight 148.326 kg (327 lb), last menstrual period 02/28/2013. Exam Physical Exam  Constitutional: She is oriented to person, place, and time. She appears well-developed and well-nourished.  Obese female, lying in bed, tearful but cooperative  Cardiovascular: Normal rate and regular rhythm.   Respiratory: Effort normal and breath sounds normal.  GI: Soft. Bowel sounds are normal.  Genitourinary: Vagina normal.  Cervical exam 1.5cm dilated, thick, high, midposition  Musculoskeletal: She exhibits edema. She exhibits no tenderness.  Neurological: She is alert and oriented to person, place, and time. She has normal reflexes. No cranial nerve deficit.  Skin: Skin is warm and dry.    Prenatal labs: ABO, Rh: O/POS/-- (12/19 1135) Antibody: NEG (05/04 0923) Rubella: 8.22 (12/19 1135) RPR: NON REAC (05/04 0923)  HBsAg: NEGATIVE (12/19 1135)  HIV: NONREACTIVE (05/04 1610)  GBS: Positive (07/02 0000)   Assessment/Plan: 18 year old G1P0 at [redacted]w[redacted]d gestation who presents with gestational hypertension for induction of labor. Patient has soft cervix, will begin with ripening and foley bulb was placed without difficulty. Some baseline fetal tachycardia at  160 with moderate variability and accels, no decels. No contractions on monitor. Check PIH labs. Will start Ancef  for GBS + once ruptured. Continuous fetal monitoring. Recheck cervical exam after foley bulb passes or in 4-6 hours.   Patient evaluated with Suzanne Short.  Suzanne Short, Suzanne A 12/10/2013, 9:20 PM  I was was consulted RE: POC and agree with above. Pos THC in pregnancy. UDS ordered. SW PP.   Suzanne Short, CNM 12/11/2013 3:54 AM

## 2013-12-10 NOTE — Progress Notes (Signed)
Scheduled for induction in 2 days  Here for diminished fetal movement, headache, dizzy and some blurry vision  Talked with dr leggett and will admit for cervical ripening and induction

## 2013-12-11 LAB — RAPID URINE DRUG SCREEN, HOSP PERFORMED
Amphetamines: NOT DETECTED
BENZODIAZEPINES: NOT DETECTED
Barbiturates: NOT DETECTED
Cocaine: NOT DETECTED
OPIATES: NOT DETECTED
Tetrahydrocannabinol: NOT DETECTED

## 2013-12-11 LAB — RPR

## 2013-12-11 LAB — TYPE AND SCREEN
ABO/RH(D): O POS
ANTIBODY SCREEN: NEGATIVE

## 2013-12-11 LAB — ABO/RH: ABO/RH(D): O POS

## 2013-12-11 MED ORDER — CEFAZOLIN SODIUM-DEXTROSE 2-3 GM-% IV SOLR
2.0000 g | Freq: Once | INTRAVENOUS | Status: DC
  Filled 2013-12-11: qty 50

## 2013-12-11 MED ORDER — TERBUTALINE SULFATE 1 MG/ML IJ SOLN: 0.2500 mg | Freq: Once | INTRAMUSCULAR | Status: AC | PRN

## 2013-12-11 MED ORDER — FAMOTIDINE 20 MG PO TABS
20.0000 mg | ORAL_TABLET | Freq: Two times a day (BID) | ORAL | Status: DC | PRN
  Administered 2013-12-11: 20 mg via ORAL
  Filled 2013-12-11: qty 1

## 2013-12-11 MED ORDER — OXYTOCIN 40 UNITS IN LACTATED RINGERS INFUSION - SIMPLE MED
1.0000 m[IU]/min | INTRAVENOUS | Status: DC
  Administered 2013-12-11: 2 m[IU]/min via INTRAVENOUS
  Administered 2013-12-13: 21 m[IU]/min via INTRAVENOUS
  Filled 2013-12-11 (×2): qty 1000

## 2013-12-11 MED ORDER — CEFAZOLIN SODIUM 1-5 GM-% IV SOLN
1.0000 g | Freq: Three times a day (TID) | INTRAVENOUS | Status: DC
  Filled 2013-12-11 (×2): qty 50

## 2013-12-11 NOTE — Progress Notes (Signed)
Suzanne Short is a 18 y.o. G1P0 at 3780w6d by ultrasound admitted for induction of labor due to Hypertension.  Subjective: Hungry at this time. Would like a shower. No other complaints. Comfortable.   Objective: BP 98/40  Pulse 97  Temp(Src) 98.6 F (37 C) (Oral)  Resp 16  Ht 5\' 7"  (1.702 m)  Wt 148.326 kg (327 lb)  BMI 51.20 kg/m2  LMP 02/28/2013      FHT:  FHR: 130 bpm, variability: moderate,  accelerations:  Present,  decelerations:  Absent UC:   irregular, every 15 minutes SVE:   Dilation: 5 Effacement (%): 60 Station: -2 Exam by:: L. Mcdaniel RN  Labs: Lab Results  Component Value Date   WBC 15.9* 12/10/2013   HGB 12.0 12/10/2013   HCT 35.1* 12/10/2013   MCV 84.2 12/10/2013   PLT 249 12/10/2013    Assessment / Plan: Induction of labor for Gestational Hypertension. Progressing slowly.   Labor: Pitocin at 16 units. Will stop for one hour for pt. to have light meal and shower. Will restart at 5pm and monitor for progression. Slow change at this point.  Preeclampsia:  no signs or symptoms of toxicity Fetal Wellbeing:  Category I Pain Control:  Epidural and Fentanyl I/D:  GBS positive. Getting Ancef given allergy hx  Anticipated MOD:  NSVD  Suzanne Short G 12/11/2013, 4:07 PM

## 2013-12-11 NOTE — Progress Notes (Signed)
Suzanne HeinzJessica Short is a 18 y.o. G1P0 at 5849w6d by ultrasound admitted for induction of labor due to Hypertension.  Subjective: Pt. Asleep at this time. Appears comfortable. Minimal contractions on pitocin. Pitocin at 1916mu/min. No complaints.   Objective: BP 132/74  Pulse 93  Temp(Src) 97.9 F (36.6 C) (Oral)  Resp 18  Ht 5\' 7"  (1.702 m)  Wt 148.326 kg (327 lb)  BMI 51.20 kg/m2  LMP 02/28/2013      FHT:  FHR: 135 bpm, variability: moderate,  accelerations:  Present,  decelerations:  Absent UC:   none SVE:   Dilation: 4 Effacement (%): Thick Station: -3 Exam by:: A,.Davis,RN  Labs: Lab Results  Component Value Date   WBC 15.9* 12/10/2013   HGB 12.0 12/10/2013   HCT 35.1* 12/10/2013   MCV 84.2 12/10/2013   PLT 249 12/10/2013    Assessment / Plan: Induction of labor due to gestational hypertension,  progressing well on pitocin  Labor: Progressing very slowly. No contractions with pitocin at 16. Giving a pitocin break at this time. Plan to reassess at next check and restart pitocin if possible.  Preeclampsia:  no signs or symptoms of toxicity Fetal Wellbeing:  Category I Pain Control:  Epidural I/D:  GBS Pos. Given Ancef for GBS ppx given allergy hx.  Anticipated MOD:  NSVD  Suzanne Short 12/11/2013, 2:04 PM

## 2013-12-11 NOTE — Progress Notes (Signed)
Randie HeinzJessica Parchment is a 18 y.o. G1P0 at 3216w6d.  Subjective: Comfortable  Objective: BP 128/70  Pulse 98  Temp(Src) 98.2 F (36.8 C) (Oral)  Resp 18  Ht 5\' 7"  (1.702 m)  Wt 148.326 kg (327 lb)  BMI 51.20 kg/m2  LMP 02/28/2013      FHT:  FHR: 145 bpm, variability: moderate,  accelerations:  Present,  decelerations:  Absent UC:   irregular, every 6-9 minutes, mild SVE:   Dilation: 4 Effacement (%): Thick Station: -3 Exam by:: RN  Labs: Lab Results  Component Value Date   WBC 15.9* 12/10/2013   HGB 12.0 12/10/2013   HCT 35.1* 12/10/2013   MCV 84.2 12/10/2013   PLT 249 12/10/2013    Assessment / Plan: Induction of labor due to gestational hypertension,  progressing well on pitocin  Labor: Progressing normally. Will start pitocin Preeclampsia:  labs stable Fetal Wellbeing:  Category I Pain Control:  Labor support without medications I/D:  n/a Anticipated MOD:  NSVD  Kendric Sindelar 12/11/2013, 6:07 AM

## 2013-12-12 ENCOUNTER — Inpatient Hospital Stay (HOSPITAL_COMMUNITY): Admission: RE | Admit: 2013-12-12 | Payer: Medicaid Other | Source: Ambulatory Visit

## 2013-12-12 ENCOUNTER — Encounter (HOSPITAL_COMMUNITY): Payer: Medicaid Other | Admitting: Anesthesiology

## 2013-12-12 ENCOUNTER — Encounter (HOSPITAL_COMMUNITY): Payer: Self-pay | Admitting: *Deleted

## 2013-12-12 ENCOUNTER — Inpatient Hospital Stay (HOSPITAL_COMMUNITY): Payer: Medicaid Other | Admitting: Anesthesiology

## 2013-12-12 LAB — CBC
HEMATOCRIT: 38.1 % (ref 36.0–46.0)
HEMOGLOBIN: 13 g/dL (ref 12.0–15.0)
MCH: 29.3 pg (ref 26.0–34.0)
MCHC: 34.1 g/dL (ref 30.0–36.0)
MCV: 85.8 fL (ref 78.0–100.0)
Platelets: 232 10*3/uL (ref 150–400)
RBC: 4.44 MIL/uL (ref 3.87–5.11)
RDW: 16.2 % — ABNORMAL HIGH (ref 11.5–15.5)
WBC: 13.6 10*3/uL — ABNORMAL HIGH (ref 4.0–10.5)

## 2013-12-12 MED ORDER — EPHEDRINE 5 MG/ML INJ
10.0000 mg | INTRAVENOUS | Status: DC | PRN
  Administered 2013-12-12: 5 mg via INTRAVENOUS
  Filled 2013-12-12: qty 2

## 2013-12-12 MED ORDER — LACTATED RINGERS IV SOLN
500.0000 mL | Freq: Once | INTRAVENOUS | Status: AC
  Administered 2013-12-12: 500 mL via INTRAVENOUS

## 2013-12-12 MED ORDER — FENTANYL CITRATE 0.05 MG/ML IJ SOLN
INTRAMUSCULAR | Status: AC
  Filled 2013-12-12: qty 2

## 2013-12-12 MED ORDER — PHENYLEPHRINE 40 MCG/ML (10ML) SYRINGE FOR IV PUSH (FOR BLOOD PRESSURE SUPPORT)
80.0000 ug | PREFILLED_SYRINGE | INTRAVENOUS | Status: DC | PRN
  Administered 2013-12-12: 80 ug via INTRAVENOUS
  Filled 2013-12-12 (×2): qty 10
  Filled 2013-12-12: qty 2

## 2013-12-12 MED ORDER — FENTANYL 2.5 MCG/ML BUPIVACAINE 1/10 % EPIDURAL INFUSION (WH - ANES)
INTRAMUSCULAR | Status: DC | PRN
  Administered 2013-12-12: 14 mL/h via EPIDURAL

## 2013-12-12 MED ORDER — BUPIVACAINE HCL (PF) 0.25 % IJ SOLN
INTRAMUSCULAR | Status: DC | PRN
  Administered 2013-12-12: 10 mL via EPIDURAL

## 2013-12-12 MED ORDER — PHENYLEPHRINE 40 MCG/ML (10ML) SYRINGE FOR IV PUSH (FOR BLOOD PRESSURE SUPPORT)
80.0000 ug | PREFILLED_SYRINGE | INTRAVENOUS | Status: DC | PRN
  Administered 2013-12-12: 80 ug via INTRAVENOUS
  Filled 2013-12-12: qty 10
  Filled 2013-12-12: qty 2

## 2013-12-12 MED ORDER — FENTANYL 2.5 MCG/ML BUPIVACAINE 1/10 % EPIDURAL INFUSION (WH - ANES)
14.0000 mL/h | INTRAMUSCULAR | Status: DC | PRN
  Administered 2013-12-12 (×3): 14 mL/h via EPIDURAL
  Filled 2013-12-12 (×4): qty 125

## 2013-12-12 MED ORDER — DIPHENHYDRAMINE HCL 50 MG/ML IJ SOLN: 12.5000 mg | INTRAMUSCULAR | Status: DC | PRN

## 2013-12-12 MED ORDER — FENTANYL CITRATE 0.05 MG/ML IJ SOLN
INTRAMUSCULAR | Status: DC | PRN
  Administered 2013-12-12: 100 ug via EPIDURAL

## 2013-12-12 MED ORDER — EPHEDRINE 5 MG/ML INJ
10.0000 mg | INTRAVENOUS | Status: DC | PRN
  Filled 2013-12-12: qty 4
  Filled 2013-12-12: qty 2

## 2013-12-12 MED ORDER — SODIUM BICARBONATE 8.4 % IV SOLN
INTRAVENOUS | Status: DC | PRN
  Administered 2013-12-12 (×2): 5 mL via EPIDURAL

## 2013-12-12 MED ORDER — FENTANYL CITRATE 0.05 MG/ML IJ SOLN
INTRAMUSCULAR | Status: AC
  Administered 2013-12-12: 100 ug via EPIDURAL
  Filled 2013-12-12: qty 2

## 2013-12-12 MED ORDER — LIDOCAINE HCL (PF) 1 % IJ SOLN
INTRAMUSCULAR | Status: DC | PRN
Start: 2013-12-12 — End: 2013-12-13
  Administered 2013-12-12: 3 mL
  Administered 2013-12-12 (×2): 5 mL

## 2013-12-12 NOTE — Progress Notes (Signed)
Suzanne HeinzJessica Short is a 18 y.o. G1P0 at 7465w0d   Subjective: Pt comfortable with epidural; appears to be sleeping  Objective: BP 156/103  Pulse 112  Temp(Src) 98.3 F (36.8 C) (Oral)  Resp 18  Ht 5\' 7"  (1.702 m)  Wt 148.326 kg (327 lb)  BMI 51.20 kg/m2  LMP 02/28/2013  all other BPs are 110-140/70-80s during night    FHT:  FHR: 145-150 bpm, variability: moderate,  accelerations:  Present,  decelerations:  Absent UC:   Difficult to trace, but seem to be q 2-4 mins with Pitocin @ 2728mu/min SVE:   Dilation: 5 Effacement (%): 70 Station: Ballotable Exam by:: Philipp DeputyKim Julio Storr, CNM; sizeable forebag ruptured for clear fluid; IUPC placed; station -2   Labs: Lab Results  Component Value Date   WBC 13.6* 12/12/2013   HGB 13.0 12/12/2013   HCT 38.1 12/12/2013   MCV 85.8 12/12/2013   PLT 232 12/12/2013    Assessment / Plan: IOL process Gest HTN with one outlying BP  Anticipate increased labor with membranes being fully ruptured now Eval MVUs and cx in 2 hrs or prn Report to oncoming team  Cam HaiSHAW, Lecil Tapp CNM 12/12/2013, 8:50 AM

## 2013-12-12 NOTE — Anesthesia Preprocedure Evaluation (Signed)
Anesthesia Evaluation  Patient identified by MRN, date of birth, ID band Patient awake    Reviewed: Allergy & Precautions, H&P , NPO status , Patient's Chart, lab work & pertinent test results  History of Anesthesia Complications Negative for: history of anesthetic complications  Airway Mallampati: II TM Distance: >3 FB Neck ROM: full    Dental no notable dental hx. (+) Teeth Intact   Pulmonary neg pulmonary ROS, former smoker,  breath sounds clear to auscultation  Pulmonary exam normal       Cardiovascular hypertension, negative cardio ROS  Rhythm:regular Rate:Normal     Neuro/Psych negative neurological ROS  negative psych ROS   GI/Hepatic negative GI ROS, Neg liver ROS,   Endo/Other  Morbid obesity  Renal/GU negative Renal ROS  negative genitourinary   Musculoskeletal   Abdominal Normal abdominal exam  (+)   Peds  Hematology negative hematology ROS (+)   Anesthesia Other Findings   Reproductive/Obstetrics (+) Pregnancy                           Anesthesia Physical Anesthesia Plan  ASA: III  Anesthesia Plan: Epidural   Post-op Pain Management:    Induction:   Airway Management Planned:   Additional Equipment:   Intra-op Plan:   Post-operative Plan:   Informed Consent: I have reviewed the patients History and Physical, chart, labs and discussed the procedure including the risks, benefits and alternatives for the proposed anesthesia with the patient or authorized representative who has indicated his/her understanding and acceptance.     Plan Discussed with:   Anesthesia Plan Comments:         Anesthesia Quick Evaluation

## 2013-12-12 NOTE — Anesthesia Procedure Notes (Signed)
Epidural Patient location during procedure: OB  Staffing Anesthesiologist: Teretha Chalupa Performed by: anesthesiologist   Preanesthetic Checklist Completed: patient identified, site marked, surgical consent, pre-op evaluation, timeout performed, IV checked, risks and benefits discussed and monitors and equipment checked  Epidural Patient position: sitting Prep: ChloraPrep Patient monitoring: heart rate, continuous pulse ox and blood pressure Approach: right paramedian Location: L3-L4 Injection technique: LOR saline  Needle:  Needle type: Tuohy  Needle gauge: 17 G Needle length: 9 cm and 9 Needle insertion depth: 7 cm Catheter type: closed end flexible Catheter size: 20 Guage Catheter at skin depth: 12 cm Test dose: negative  Assessment Events: blood not aspirated, injection not painful, no injection resistance, negative IV test and no paresthesia  Additional Notes   Patient tolerated the insertion well without complications.   

## 2013-12-12 NOTE — Progress Notes (Signed)
   [name] is a 18 y.o. G1P0000 at 6639w0d  admitted for induction of labor due to Post dates. .  Subjective:   Feels some pressure with contractions, but just received epidural bolus and is pain free  Objective: Filed Vitals:   12/12/13 2100 12/12/13 2125 12/12/13 2126 12/12/13 2132  BP: 132/69   132/75  Pulse: 96   101  Temp:  99 F (37.2 C) 100.1 F (37.8 C)   TempSrc:  Oral Axillary   Resp:      Height:      Weight:      SpO2:          FHT:  FHR: 180 bpm, variability: moderate,  accelerations:  Abscent,  decelerations:  Present some variables, occ late Pt has O2 on and receiving an IV bolus UC:   MVU's ~ 180 mmHg SVE:   Dilation: 10 Effacement (%): 100 Station: -1 Exam by:: Drenda FreezeFran Crez-Dishmon, CNM Pitocin @ 21 mu/min  Labs: Lab Results  Component Value Date   WBC 13.6* 12/12/2013   HGB 13.0 12/12/2013   HCT 38.1 12/12/2013   MCV 85.8 12/12/2013   PLT 232 12/12/2013    Assessment / Plan: Pt has labored down for about 1.5 hours, pushed about 45 minutes, and has been laboring down another hour.  No descent of the head/+ caput. Unable to tell position  Discussed with Dr. Jolayne Pantheronstant.  Will start pushing again.  Labor: Stalled descent Fetal Wellbeing:  Category II Pain Control:  Epidural Anticipated MOD:  NSVD  CRESENZO-DISHMAN,Kees Idrovo 12/12/2013, 10:14 PM

## 2013-12-12 NOTE — Progress Notes (Signed)
LABOR PROGRESS NOTE  Suzanne HeinzJessica Short is a 18 y.o. G1P0000 at 8134w0d  admitted for induction of labor due to prolonged pregnancy.  Subjective: Epidural re-dosed and feeling much better.  Objective: BP 117/83  Pulse 117  Temp(Src) 98.2 F (36.8 C) (Oral)  Resp 18  Ht 5\' 7"  (1.702 m)  Wt 148.326 kg (327 lb)  BMI 51.20 kg/m2  LMP 02/28/2013 or  Filed Vitals:   12/12/13 1635 12/12/13 1701 12/12/13 1730 12/12/13 1802  BP:  130/76 126/57 117/83  Pulse:  147 153 117  Temp: 98.2 F (36.8 C)     TempSrc: Oral     Resp:  20 20 18   Height:      Weight:           FHT:  FHR: 150 bpm, variability: moderate,  accelerations:  Present,  decelerations:  Present early UC:   regular, every 3 minutes SVE:   Dilation: 10 Effacement (%): 100 Station: -1 Exam by:: Dr. Loreta AveAcosta  Dilation: 10 Dilation Complete Date: 12/12/13 Dilation Complete Time: 1854 Effacement (%): 100 Cervical Position: Anterior Station: -1 Presentation: Vertex Exam by:: Dr. Loreta AveAcosta  Pitocin @ 20 mu/min  Labs: Lab Results  Component Value Date   WBC 13.6* 12/12/2013   HGB 13.0 12/12/2013   HCT 38.1 12/12/2013   MCV 85.8 12/12/2013   PLT 232 12/12/2013    Assessment / Plan: Induction of labor due to prolonged pregnancy,  progressing well on pitocin  Labor: Progressing normally on pit, suspected LOT presentation, discussed position changes with nurse.  Pelvis is adequate. Fetal Wellbeing:  Category I Pain Control:  Epidural Anticipated MOD:  NSVD  Onesti Bonfiglio ROCIO, MD 12/12/2013, 7:17 PM

## 2013-12-13 ENCOUNTER — Encounter (HOSPITAL_COMMUNITY): Payer: Self-pay | Admitting: *Deleted

## 2013-12-13 DIAGNOSIS — O9989 Other specified diseases and conditions complicating pregnancy, childbirth and the puerperium: Secondary | ICD-10-CM

## 2013-12-13 DIAGNOSIS — O139 Gestational [pregnancy-induced] hypertension without significant proteinuria, unspecified trimester: Secondary | ICD-10-CM

## 2013-12-13 DIAGNOSIS — O99892 Other specified diseases and conditions complicating childbirth: Secondary | ICD-10-CM

## 2013-12-13 DIAGNOSIS — O99344 Other mental disorders complicating childbirth: Secondary | ICD-10-CM

## 2013-12-13 MED ORDER — LANOLIN HYDROUS EX OINT: TOPICAL_OINTMENT | CUTANEOUS | Status: DC | PRN

## 2013-12-13 MED ORDER — PRENATAL MULTIVITAMIN CH
1.0000 | ORAL_TABLET | Freq: Every day | ORAL | Status: DC
  Administered 2013-12-13 – 2013-12-14 (×2): 1 via ORAL
  Filled 2013-12-13 (×2): qty 1

## 2013-12-13 MED ORDER — ONDANSETRON HCL 4 MG/2ML IJ SOLN: 4.0000 mg | INTRAMUSCULAR | Status: DC | PRN

## 2013-12-13 MED ORDER — ONDANSETRON HCL 4 MG PO TABS: 4.0000 mg | ORAL_TABLET | ORAL | Status: DC | PRN

## 2013-12-13 MED ORDER — DIBUCAINE 1 % RE OINT
1.0000 "application " | TOPICAL_OINTMENT | RECTAL | Status: DC | PRN
  Administered 2013-12-13: 1 via RECTAL
  Filled 2013-12-13: qty 28

## 2013-12-13 MED ORDER — SENNOSIDES-DOCUSATE SODIUM 8.6-50 MG PO TABS
2.0000 | ORAL_TABLET | ORAL | Status: DC
  Administered 2013-12-13: 2 via ORAL
  Filled 2013-12-13: qty 2

## 2013-12-13 MED ORDER — TETANUS-DIPHTH-ACELL PERTUSSIS 5-2.5-18.5 LF-MCG/0.5 IM SUSP
0.5000 mL | Freq: Once | INTRAMUSCULAR | Status: DC
Start: 2013-12-14 — End: 2013-12-14

## 2013-12-13 MED ORDER — SIMETHICONE 80 MG PO CHEW: 80.0000 mg | CHEWABLE_TABLET | ORAL | Status: DC | PRN

## 2013-12-13 MED ORDER — BENZOCAINE-MENTHOL 20-0.5 % EX AERO
1.0000 "application " | INHALATION_SPRAY | CUTANEOUS | Status: DC | PRN
  Administered 2013-12-13: 1 via TOPICAL
  Filled 2013-12-13: qty 56

## 2013-12-13 MED ORDER — DIPHENHYDRAMINE HCL 25 MG PO CAPS: 25.0000 mg | ORAL_CAPSULE | Freq: Four times a day (QID) | ORAL | Status: DC | PRN

## 2013-12-13 MED ORDER — IBUPROFEN 600 MG PO TABS
600.0000 mg | ORAL_TABLET | Freq: Four times a day (QID) | ORAL | Status: DC
  Administered 2013-12-13 – 2013-12-14 (×6): 600 mg via ORAL
  Filled 2013-12-13 (×6): qty 1

## 2013-12-13 MED ORDER — ZOLPIDEM TARTRATE 5 MG PO TABS: 5.0000 mg | ORAL_TABLET | Freq: Every evening | ORAL | Status: DC | PRN

## 2013-12-13 MED ORDER — OXYCODONE HCL 5 MG PO TABS: 5.0000 mg | ORAL_TABLET | ORAL | Status: DC | PRN

## 2013-12-13 MED ORDER — WITCH HAZEL-GLYCERIN EX PADS
1.0000 "application " | MEDICATED_PAD | CUTANEOUS | Status: DC | PRN
  Administered 2013-12-13: 1 via TOPICAL

## 2013-12-13 NOTE — Anesthesia Postprocedure Evaluation (Signed)
Anesthesia Post Note  Patient: Suzanne Short  Procedure(s) Performed: * No procedures listed *  Anesthesia type: Epidural  Patient location: Mother/Baby  Post pain: Pain level controlled  Post assessment: Post-op Vital signs reviewed  Last Vitals:  Filed Vitals:   12/13/13 0500  BP: 136/77  Pulse: 108  Temp: 37.1 C  Resp: 20    Post vital signs: Reviewed  Level of consciousness: awake  Complications: No apparent anesthesia complications

## 2013-12-13 NOTE — Lactation Note (Signed)
This note was copied from the chart of Suzanne [name]. Lactation Consultation Note  Patient Name: Suzanne [name] UJWJX'BToday's Date: 12/13/2013 Reason for consult: Follow-up assessment Mom called for assist with latching baby. LC assisted Mom with positioning and demonstrated to parents how to sandwich nipple/aerola to help baby latch. After few attempts baby was to latch and develop a good suckling pattern. Basic teaching reviewed with Mom. Cluster feeding discussed with parents. Encouraged to call for assist as needed.   Maternal Data    Feeding Feeding Type: Breast Fed  LATCH Score/Interventions Latch: Repeated attempts needed to sustain latch, nipple held in mouth throughout feeding, stimulation needed to elicit sucking reflex. Intervention(s): Adjust position;Assist with latch;Breast massage;Breast compression  Audible Swallowing: A few with stimulation  Type of Nipple: Everted at rest and after stimulation  Comfort (Breast/Nipple): Soft / non-tender     Hold (Positioning): Assistance needed to correctly position infant at breast and maintain latch. Intervention(s): Breastfeeding basics reviewed;Support Pillows;Position options;Skin to skin  LATCH Score: 7  Lactation Tools Discussed/Used     Consult Status Consult Status: Follow-up Date: 12/14/13 Follow-up type: In-patient    Alfred LevinsGranger, Suzanne Short Ann 12/13/2013, 2:14 PM

## 2013-12-13 NOTE — Lactation Note (Signed)
This note was copied from the chart of Suzanne Randie HeinzJessica Purdie. Lactation Consultation Note   Initial consult with this mom and baby, now 8 hours old, and post term at 5841 1/[redacted] weeks gestation, weighing 9 lbs 12.4 ounces. The baby has not fed, is very fussy with stimulatin, but sleeps once skin to skin with mom. I did hand expression on mom, and was able to express at least 0.5 mls of colostrum, which I cup and finger fed to the baby. Aiden would not suck on my finger, and would not latch to mom. Some brief breast feeding teaching done with mo, lactation services reviewed, and baby left skin to skin with mom. Mom knows to call for questions/concerns.  Patient Name: Suzanne Short UJWJX'BToday's Date: 12/13/2013 Reason for consult: Initial assessment   Maternal Data Formula Feeding for Exclusion: No Has patient been taught Hand Expression?: Yes Does the patient have breastfeeding experience prior to this delivery?: No  Feeding Feeding Type: Breast Milk  LATCH Score/Interventions Latch: Too sleepy or reluctant, no latch achieved, no sucking elicited. Intervention(s): Skin to skin;Teach feeding cues;Waking techniques  Audible Swallowing: None  Type of Nipple: Everted at rest and after stimulation  Comfort (Breast/Nipple): Soft / non-tender     Hold (Positioning): Assistance needed to correctly position infant at breast and maintain latch. Intervention(s): Breastfeeding basics reviewed;Support Pillows;Position options;Skin to skin  LATCH Score: 5  Lactation Tools Discussed/Used     Consult Status Consult Status: Follow-up Date: 12/14/13 Follow-up type: In-patient    Alfred LevinsLee, Tyrek Lawhorn Anne 12/13/2013, 10:13 AM

## 2013-12-13 NOTE — Lactation Note (Signed)
This note was copied from the chart of Suzanne Randie HeinzJessica Paulette. Lactation Consultation Note  Mother having difficulty latching infant.  Assisting with breast compression and baby latched.   Reviewed how to [place] baby and had mother perform latching teach back to build her confidence. Rhythmical sucking and swallows observed. Suggested mother massage her breast to keep infant stimulated. Mom encouraged to feed baby 8-12 times/24 hours and with feeding cues and lots of STS. Encouraged mother to call for further assistance.    Patient Name: Suzanne Short Reason for consult: Follow-up assessment   Maternal Data    Feeding Feeding Type: Breast Fed Length of feed: 10 min  LATCH Score/Interventions Latch: Repeated attempts needed to sustain latch, nipple held in mouth throughout feeding, stimulation needed to elicit sucking reflex. Intervention(s): Skin to skin Intervention(s): Adjust position;Assist with latch;Breast massage;Breast compression  Audible Swallowing: Spontaneous and intermittent  Type of Nipple: Everted at rest and after stimulation  Comfort (Breast/Nipple): Soft / non-tender     Hold (Positioning): Assistance needed to correctly position infant at breast and maintain latch.  LATCH Score: 8  Lactation Tools Discussed/Used     Consult Status Consult Status: Follow-up Date: 12/14/13 Follow-up type: In-patient    Dahlia ByesBerkelhammer, Borna Wessinger [facility] Center[facility]oschen Short, 10:03 PM

## 2013-12-13 NOTE — Progress Notes (Signed)
Patient says she can take ibuprofen despite allergies to alleve and acetaminophen; prefers ibuprofen to Oxy IR.  Spoke to Devon EnergyFran Cresenzo-Dishmon who ordered ibuprofen 600 mg p.o. q6 hours.

## 2013-12-13 NOTE — Progress Notes (Signed)
Clinical Social Work Department PSYCHOSOCIAL ASSESSMENT - MATERNAL/CHILD 12/13/2013  Patient:  Suzanne Short,Suzanne Short  Account Number:  [id number]  Admit Date:  12/10/2013  Childs Name:   Aiden Short    Clinical Social Worker:  Jeris Easterly, CLINICAL SOCIAL WORKER   Date/Time:  12/13/2013 11:45 AM  Date Referred:  12/13/2013   Referral source  Central Nursery     Referred reason  Substance Abuse   Other referral source:    I:  FAMILY / HOME ENVIRONMENT Child's legal guardian:  PARENT  Guardian - Name Guardian - Age Guardian - Address  Suzanne Short 18 [address], [address] [place], [place] [address]  Suzanne Short  same as above   Other household support members/support persons Other support:   MOB and FOB reported that their parents live within 2 miles of their home.  They shared that they are supported by their family and friends as they become first time parents.    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  FOB reported that he works in the shipping ind[employer] and is also a truck driver.   Financial resources:  Medicaid If Medicaid - County:  [place] Other  Food Stamps  WIC   School / Grade:   Maternity Care Coordinator / Child Services Coordination / Early Interventions:  Cultural issues impacting care:   None reported    III  STRENGTHS Strengths  Adequate Resources  Home prepared for Child (including basic supplies)  Supportive family/friends   Strength comment:    IV  RISK FACTORS AND CURRENT PROBLEMS Current Problem:  YES   Risk Factor & Current Problem Patient Issue Family Issue Risk Factor / Current Problem Comment  Substance Abuse Y N MOB endorsed history of THC prior to pregnancy.  She acknowledges positive toxicology screen for THC on 12/19. Per MOB, she did not smoke THC once she learned  that she was pregnant.    V  SOCIAL WORK ASSESSMENT CSW met with MOB in her room to complete assessment.  Consult ordered due to THC use during pregnancy.  FOB present in room, MOB provided consent for CSW to complete assessment with FOB present.  MOB and FOB receptive to completing assessment, were open and receptive to answering all questions asked.  Affect and mood appropriate to setting, answered questions appropriately.    MOB and FOB discussed excitement related to becoming parents.  CSW explored additional thoughts and feelings related to becoming parents, and normalized anxieties and fears as they adjust to this change.  MOB and FOB shared that they feel supported by family and friends, particularly by both set parents who live nearby.  They discussed having everything that the need, and also looking forward to time off from work to spend time as a new family.     MOB denied previous mental health history, but was receptive to learning about post-partum depression and anxiety.  MOB and FOB maintained eye contact throughout this portion of the assessment, and expressed willingness to reach out to support systems and MD if symptoms occur.  CSW shared reason for consult, and MOB acknowledged THC use. She admits to using THC on a regular basis prior to pregnancy, but shared that she stopped once she learned that she is pregnant.  MOB denied any difficulties with stopping THC use, shared that she noted numerous positive   benefits including being more active, having more energy, and improved mood.  FOB confirmed these positive benefits.  MOB shared belief that it was also easy because, "it was best for my baby".   MOB denied additional substance use, denied previous participation in substance abuse treatment.  MOB denied need for treatment at discharge as she was able to easily quit during her pregnancy.  She denied belief that she will re-start smoking THC since she has realized the positive gains of not  using.  CSW discussed meconium drug screen, and CPS report if meconium screen is positive. MOB and FOB verbalized understanding, but non-verbals highlighted that they felt uncomfortable or fearful.  MOB and FOB reported that experienced CPS involvement as children, but had positive experiences.  They denied questions,concerns, or fears associated with potential CPS involvement, and verbalized understanding of drug screen policy.   MOB and FOB aware of CSW availability throughout hospital stay.    No barriers to discharge.   VI SOCIAL WORK PLAN Social Work Plan  Patient/Family Education  Information/Referral to Community Resources  No Further Intervention Required / No Barriers to Discharge   Type of pt/family education:   Post-partum depression and anxiety   If child protective services report - county:   If child protective services report - date:   Information/referral to community resources comment:   CSW disucssed availability for substance abuse treatment in area.  MOB shared belief that substance abuse is not a problem as she reflected upon ability to abstain from THC use once she learned that she was pregnant.   Other social work plan:     

## 2013-12-13 NOTE — Progress Notes (Signed)
Suzanne Short is a 18 y.o. G1P0000 at 5614w1d [facility]  Subjective: Patient actively pushing  Objective: BP 132/75  Pulse 101  Temp(Src) 100.1 F (37.8 C) (Axillary)  Resp 20  Ht 5\' 7"  (1.702 m)  Wt 327 lb (148.326 kg)  BMI 51.20 kg/m2  SpO2 100%  LMP 02/28/2013      FHT:  FHR: 160 bpm, variability: moderate,  accelerations:  Present,  decelerations:  Absent UC:   regular, every 1-2 minutes SVE:   Dilation: 10 Effacement (%): 100 Station: +1 Exam by:: Catalina AntiguaPeggy Marcanthony Sleight, MD  Labs: Lab Results  Component Value Date   WBC 13.6* 12/12/2013   HGB 13.0 12/12/2013   HCT 38.1 12/12/2013   MCV 85.8 12/12/2013   PLT 232 12/12/2013    Assessment / Plan: Protracted active phase  Fetal Wellbeing:  Category I Pain Control:  Epidural Anticipated MOD:  Patient actively pushing for 2 hours. Encouraged to continue pushing. Will re-evaluate in the next hour for possible operative delivery vs cesarean section  Suzanne Short 12/13/2013, 12:09 AM

## 2013-12-13 NOTE — Progress Notes (Signed)
UR completed 

## 2013-12-14 NOTE — Discharge Summary (Signed)
`````  Attestation of Attending Supervision of Advanced Practitioner: Evaluation and management procedures were performed by the PA/NP/CNM/OB Fellow under my supervision/collaboration. Chart reviewed and agree with management and plan.  Gabriela Giannelli V 12/14/2013 3:32 PM

## 2013-12-14 NOTE — Discharge Instructions (Signed)

## 2013-12-14 NOTE — Discharge Summary (Signed)
Obstetric Discharge Summary Reason for Admission: induction of labor Prenatal Procedures: none Intrapartum Procedures: spontaneous vaginal delivery Postpartum Procedures: none Complications-Operative and Postpartum: vaginal laceration Hemoglobin  Date Value Ref Range Status  12/12/2013 13.0  12.0 - 15.0 g/dL Final     HCT  Date Value Ref Range Status  12/12/2013 38.1  36.0 - 46.0 % Final    Patient is a 18 year old G1P1001 at 6818w1d gestation now s/p vaginal delivery. Patient was admitted for scheduled induction of labor for gestational hypertension diagnosed after 39 weeks. She underwent cervical ripening with foley bulb and cytotec and followed with pitocin. After 48 hours, she made progress to complete dilation. There was some delay of fetal descent in the hours leading to vaginal delivery which occurred at 0126 on 7/31. There was a mild shoulder dystocia corrected with rotation of the posterior shoulder. 2nd degree perineal laceration was repaired with 2-0 vicryl in usual fashion. Blood pressure was good after delivery. Pain and bleeding were minimal. Patient was discharged home with followup with prenatal provider in 4-6 weeks.   Physical Exam:  General: alert, cooperative and no distress Lochia: appropriate Uterine Fundus: firm Incision: n/a DVT Evaluation: No evidence of DVT seen on physical exam. Negative Homan's sign. No cords or calf tenderness.  Discharge Diagnoses: Term Pregnancy-delivered  Discharge Information: Date: 12/14/2013 Activity: pelvic rest Diet: routine Medications: Ibuprofen Condition: stable Instructions: Contact physician for any worsening bleeding, pain, fever or nausea Discharge to: home Follow-up Information   Follow up with Tilda BurrowFERGUSON,JOHN V, MD. Schedule an appointment as soon as possible for a visit in 4 weeks.   Specialties:  Obstetrics and Gynecology, Radiology   Contact information:   [address] [place] [id number] [phone]        Newborn Data: Live born female  Birth Weight: 9 lb 12.4 oz (4435 g) APGAR: 5, 7  Home with mother.   Markus JarvisStephens, Devin A 12/14/2013, 7:05 AM  I have seen and examined this patient and agree with above documentation in the resident's note.  - has changed her mind, would like to have circ done as outpt, will discuss with secretary as to how to get refund.  Fredirick LatheKristy Fredrick [place], MD OB Fellow 12/14/2013 11:59 AM

## 2014-01-16 ENCOUNTER — Encounter: Payer: Self-pay | Admitting: Obstetrics & Gynecology

## 2014-01-16 ENCOUNTER — Ambulatory Visit (INDEPENDENT_AMBULATORY_CARE_PROVIDER_SITE_OTHER): Payer: Medicaid Other | Admitting: Obstetrics & Gynecology

## 2014-01-16 ENCOUNTER — Ambulatory Visit (INDEPENDENT_AMBULATORY_CARE_PROVIDER_SITE_OTHER): Payer: Medicaid Other | Admitting: Advanced Practice Midwife

## 2014-01-16 DIAGNOSIS — Z30013 Encounter for initial prescription of injectable contraceptive: Secondary | ICD-10-CM

## 2014-01-16 DIAGNOSIS — Z3202 Encounter for pregnancy test, result negative: Secondary | ICD-10-CM

## 2014-01-16 DIAGNOSIS — Z3049 Encounter for surveillance of other contraceptives: Secondary | ICD-10-CM

## 2014-01-16 LAB — POCT URINE PREGNANCY: PREG TEST UR: NEGATIVE

## 2014-01-16 MED ORDER — MEDROXYPROGESTERONE ACETATE 150 MG/ML IM SUSP
150.0000 mg | Freq: Once | INTRAMUSCULAR | Status: AC
  Administered 2014-01-16: 150 mg via INTRAMUSCULAR

## 2014-01-16 MED ORDER — MEDROXYPROGESTERONE ACETATE 150 MG/ML IM SUSP: 150.0000 mg | INTRAMUSCULAR | Status: DC

## 2014-01-16 NOTE — Progress Notes (Signed)
Patient ID: Suzanne Short, female   DOB: 1995/11/01, 18 y.o.   MRN: [id number] Delivery Note  After about 3 hours of pushing, at a viable female was delivered via (Presentation: LOA ). APGAR:5/8 ; weight 9# 12 oz. The shoulders were not forthcoming, so the posterior (left) axilla was grasped with my index finger, and the baby was rotated clockwise into the oblique diameter. At this point, the (now) anterior shoulder was released, and the baby delivered. At no time was any traction placed on the baby's head.This maneuver was performed by CNM, and once the shoulder was released, the delivery was handed back to Dr. Andria Meuse. 40 units of pitocin diluted in 1000cc LR was infused rapidly IV. The placenta separated spontaneously and delivered via CCT and maternal pushing effort. It was inspected and appears to be intact with a 3 VC. Cord gas drawn but clotted. There were the following complications: mild shoulder dystocia  Anesthesia: Epidural and local  Episiotomy: none  Lacerations: 2nd degree perineal  Suture Repair: 2.0 vicryl  Est. Blood Loss (mL): 300    Pt doing well No complaints  Wants depo  Exam NEFG Vagina pink moist Uterus normal involution Adnexa negative  Normal pp exam Follow up 1 year

## 2014-03-17 ENCOUNTER — Encounter: Payer: Self-pay | Admitting: Obstetrics & Gynecology

## 2014-04-09 ENCOUNTER — Telehealth: Payer: Self-pay | Admitting: *Deleted

## 2014-04-09 ENCOUNTER — Ambulatory Visit: Payer: Medicaid Other

## 2014-04-14 ENCOUNTER — Ambulatory Visit: Payer: Medicaid Other

## 2014-04-14 ENCOUNTER — Other Ambulatory Visit: Payer: Self-pay | Admitting: *Deleted

## 2014-04-14 MED ORDER — MEDROXYPROGESTERONE ACETATE 150 MG/ML IM SUSP: 150.0000 mg | INTRAMUSCULAR | Status: DC

## 2014-04-14 NOTE — Telephone Encounter (Signed)
Medication refilled and sent to [facility] pharmacy  

## 2014-04-23 ENCOUNTER — Encounter: Payer: Self-pay | Admitting: Women's Health

## 2014-04-23 ENCOUNTER — Ambulatory Visit (INDEPENDENT_AMBULATORY_CARE_PROVIDER_SITE_OTHER): Payer: Medicaid Other | Admitting: Women's Health

## 2014-04-23 VITALS — BP 138/74 | Ht 66.75 in | Wt 293.0 lb

## 2014-04-23 DIAGNOSIS — Z01419 Encounter for gynecological examination (general) (routine) without abnormal findings: Secondary | ICD-10-CM

## 2014-04-23 DIAGNOSIS — Z308 Encounter for other contraceptive management: Secondary | ICD-10-CM

## 2014-04-23 DIAGNOSIS — L732 Hidradenitis suppurativa: Secondary | ICD-10-CM | POA: Insufficient documentation

## 2014-04-23 DIAGNOSIS — Z113 Encounter for screening for infections with a predominantly sexual mode of transmission: Secondary | ICD-10-CM

## 2014-04-23 DIAGNOSIS — F17200 Nicotine dependence, unspecified, uncomplicated: Secondary | ICD-10-CM

## 2014-04-23 MED ORDER — SULFAMETHOXAZOLE-TRIMETHOPRIM 800-160 MG PO TABS: 1.0000 | ORAL_TABLET | Freq: Two times a day (BID) | ORAL | Status: DC

## 2014-04-23 NOTE — Patient Instructions (Signed)
Hidradenitis Suppurativa, Sweat Gland Abscess Hidradenitis suppurativa is a long lasting (chronic), uncommon disease of the sweat glands. With this, boil-like lumps and scarring develop in the groin, some times under the arms (axillae), and under the breasts. It may also uncommonly occur behind the ears, in the crease of the buttocks, and around the genitals.  CAUSES  The cause is from a blocking of the sweat glands. They then become infected. It may cause drainage and odor. It is not contagious. So it cannot be given to someone else. It most often shows up in puberty (about 10 to 20 years of age). But it may happen much later. It is similar to acne which is a disease of the sweat glands. This condition is slightly more common in African-Americans and women. SYMPTOMS   Hidradenitis usually starts as one or more red, tender, swellings in the groin or under the arms (axilla).  Over a period of hours to days the lesions get larger. They often open to the skin surface, draining clear to yellow-colored fluid.  The infected area heals with scarring. DIAGNOSIS  Your caregiver makes this diagnosis by looking at you. Sometimes cultures (growing germs on plates in the lab) may be taken. This is to see what germ (bacterium) is causing the infection.  TREATMENT   Topical germ killing medicine applied to the skin (antibiotics) are the treatment of choice. Antibiotics taken by mouth (systemic) are sometimes needed when the condition is getting worse or is severe.  Avoid tight-fitting clothing which traps moisture in.  Dirt does not cause hidradenitis and it is not caused by poor hygiene.  Involved areas should be cleaned daily using an antibacterial soap. Some patients find that the liquid form of Lever 2000, applied to the involved areas as a lotion after bathing, can help reduce the odor related to this condition.  Sometimes surgery is needed to drain infected areas or remove scarred tissue. Removal of  large amounts of tissue is used only in severe cases.  Birth control pills may be helpful.  Oral retinoids (vitamin A derivatives) for 6 to 12 months which are effective for acne may also help this condition.  Weight loss will improve but not cure hidradenitis. It is made worse by being overweight. But the condition is not caused by being overweight.  This condition is more common in people who have had acne.  It may become worse under stress. There is no medical cure for hidradenitis. It can be controlled, but not cured. The condition usually continues for years with periods of getting worse and getting better (remission). Document Released: 12/15/2003 Document Revised: 07/25/2011 Document Reviewed: 08/02/2013 Exit[employer] Patient Information 2015 [employer]. This information is not intended to replace advice given to you by your health care provider. Make sure you discuss any questions you have with your health care provider.  

## 2014-04-23 NOTE — Progress Notes (Addendum)
Patient ID: Suzanne HeinzJessica Bojarski, female   DOB: 1996-03-16, 18 y.o.   MRN: [id number] Subjective:   Suzanne Short is a 18 y.o. 421P1001 Caucasian female here for a routine Family Planning [facility] well-woman exam.  No LMP recorded. Patient has had an injection.   Last depo 01/16/14, tried to get 2nd and was told she needed FP Mcaid physical first. No sex in 3wks. Had depo at home.  Current complaints: none PCP: [facility], hasn't been in years         Social History: Sexual: heterosexual Marital Status: engaged Living situation: with fiance and child Occupation: works at [employer] Tobacco/alcohol: smokes 3-4 cigs/day, no etoh Illicit drugs: no history of illicit drug use  The following portions of the patient's history were reviewed and updated as appropriate: allergies, current medications, past family history, past medical history, past social history, past surgical history and problem list.  Past Medical History Past Medical History  Diagnosis Date  . Swelling   . Bladder infection, chronic   . Supervision of normal pregnancy in first trimester 05/03/2013  . GERD (gastroesophageal reflux disease)   . Hypertension   . Renal disorder     chronic bladder infections    Past Surgical History Past Surgical History  Procedure Laterality Date  . Tonsillectomy      Gynecologic History G1P1001  No LMP recorded. Patient has had an injection. Contraception: Depo-Provera injections Last Pap: n/a <21yo. Results were: n/a Last mammogram: never. Results were: n/a Last TCS: never  Obstetric History OB History  Gravida Para Term Preterm AB SAB TAB Ectopic Multiple Living  1 1 1  0 0 0 0 0 0 1    # Outcome Date GA Lbr Len/2nd Weight Sex Delivery Anes PTL Lv  1 Term 12/13/13 [id number] 07:24 / 06:32 9 lb 12.4 oz (4.435 kg) M Vag-Spont EPI  Y      Current Medications Current Outpatient Prescriptions on File Prior to Visit  Medication Sig Dispense Refill  . medroxyPROGESTERone (DEPO-PROVERA) 150 MG/ML injection  Inject 1 mL (150 mg total) into the muscle every 3 (three) months. 1 mL 4  . calcium carbonate (TUMS - DOSED IN MG ELEMENTAL CALCIUM) 500 MG chewable tablet Chew 1 tablet by mouth daily as needed for indigestion or heartburn.    . diphenhydrAMINE (BENADRYL) 25 mg capsule Take 50 mg by mouth every 6 (six) hours as needed. Allergies    . EPINEPHrine (EPI-PEN) 0.3 mg/0.3 mL DEVI Inject 0.3 mLs (0.3 mg total) into the muscle once. (Patient not taking: Reported on 04/23/2014) 1 Device 1  . ibuprofen (ADVIL,MOTRIN) 100 MG tablet Take 100 mg by mouth every 6 (six) hours as needed for fever.    . Prenatal Vit-Fe Fumarate-FA (PRENATAL MULTIVITAMIN) TABS tablet Take 1 tablet by mouth daily at 12 noon.     No current facility-administered medications on file prior to visit.    Review of Systems Patient denies any headaches, blurred vision, shortness of breath, chest pain, abdominal pain, problems with bowel movements, urination, or intercourse.  Objective:  BP 138/74 mmHg  Ht 5' 6.75" (1.695 m)  Wt 293 lb (132.904 kg)  BMI 46.26 kg/m2  Breastfeeding? No Physical Exam  General:  Well developed, well nourished, no acute distress. She is alert and oriented x3. Skin:  Warm and dry Neck:  Midline trachea, no thyromegaly or nodules Cardiovascular: Regular rate and rhythm, no murmur heard Lungs:  Effort normal, all lung fields clear to auscultation bilaterally Breasts:  No dominant palpable mass, retraction, or  nipple discharge. Few healing boils on Rt breast.  Abdomen:  Soft, non tender, no hepatosplenomegaly or masses Pelvic:  External genitalia is normal in appearance.  The vagina is normal in appearance. The cervix is bulbous, no CMT.  Thin prep pap is not done <21yo. Uterus is felt to be normal size, shape, and contour.  No adnexal masses or tenderness noted. Extremities:  No swelling or varicosities noted. Few active tender boils bilateral inner thighs, many old scars c/w hidradenitis  Psych:  She  has a normal mood and affect  Assessment:   Healthy well-woman exam STD screening  Contraception management Hidradenitis Rt breast, bilateral inner thighs Smoker  Plan:  Rx septra ds bid x 14d, use dial soap, no shaving for hidradenitis GC/CT from urine, HIV, RPR, HSV2 today  No sex until after depo, F/U tomorrow after work for depo, then q 3 months for depo. Plan for pap @ 21yo Advised smoking cessation, discussed risks to herself and infant.  Mammogram @40yo  or sooner if problems Colonoscopy @20yo  or sooner if problems  Marge DuncansBooker, Daeshon Grammatic[facility] Randall CNM, WHNP-BC 04/23/2014 4:20 PM

## 2014-04-24 ENCOUNTER — Ambulatory Visit (INDEPENDENT_AMBULATORY_CARE_PROVIDER_SITE_OTHER): Payer: Medicaid Other | Admitting: Obstetrics and Gynecology

## 2014-04-24 DIAGNOSIS — Z3200 Encounter for pregnancy test, result unknown: Secondary | ICD-10-CM

## 2014-04-24 DIAGNOSIS — Z3042 Encounter for surveillance of injectable contraceptive: Secondary | ICD-10-CM

## 2014-04-24 DIAGNOSIS — Z308 Encounter for other contraceptive management: Secondary | ICD-10-CM

## 2014-04-24 LAB — GC/CHLAMYDIA PROBE AMP
CT PROBE, AMP APTIMA: NEGATIVE
GC Probe RNA: NEGATIVE

## 2014-04-24 LAB — POCT URINE PREGNANCY: Preg Test, Ur: NEGATIVE

## 2014-04-24 LAB — HSV 2 ANTIBODY, IGG

## 2014-04-24 LAB — RPR

## 2014-04-24 LAB — HIV ANTIBODY (ROUTINE TESTING W REFLEX): HIV: NONREACTIVE

## 2014-04-24 MED ORDER — MEDROXYPROGESTERONE ACETATE 150 MG/ML IM SUSP
150.0000 mg | Freq: Once | INTRAMUSCULAR | Status: AC
  Administered 2014-04-24: 150 mg via INTRAMUSCULAR

## 2014-07-02 ENCOUNTER — Emergency Department (HOSPITAL_COMMUNITY)
Admission: EM | Admit: 2014-07-02 | Discharge: 2014-07-02 | Disposition: A | Discharge status: Home / Self Care | Payer: Medicaid Other | Attending: Emergency Medicine | Admitting: Emergency Medicine

## 2014-07-02 ENCOUNTER — Encounter (HOSPITAL_COMMUNITY): Payer: Self-pay | Admitting: Emergency Medicine

## 2014-07-02 DIAGNOSIS — Z792 Long term (current) use of antibiotics: Secondary | ICD-10-CM | POA: Insufficient documentation

## 2014-07-02 DIAGNOSIS — Z3202 Encounter for pregnancy test, result negative: Secondary | ICD-10-CM | POA: Insufficient documentation

## 2014-07-02 DIAGNOSIS — I10 Essential (primary) hypertension: Secondary | ICD-10-CM | POA: Insufficient documentation

## 2014-07-02 DIAGNOSIS — Z79899 Other long term (current) drug therapy: Secondary | ICD-10-CM | POA: Insufficient documentation

## 2014-07-02 DIAGNOSIS — Z880 Allergy status to penicillin: Secondary | ICD-10-CM | POA: Insufficient documentation

## 2014-07-02 DIAGNOSIS — K529 Noninfective gastroenteritis and colitis, unspecified: Secondary | ICD-10-CM

## 2014-07-02 DIAGNOSIS — Z87448 Personal history of other diseases of urinary system: Secondary | ICD-10-CM | POA: Insufficient documentation

## 2014-07-02 DIAGNOSIS — Z720 Tobacco use: Secondary | ICD-10-CM | POA: Insufficient documentation

## 2014-07-02 LAB — URINALYSIS, ROUTINE W REFLEX MICROSCOPIC
GLUCOSE, UA: NEGATIVE mg/dL
KETONES UR: NEGATIVE mg/dL
Nitrite: NEGATIVE
PH: 5.5 (ref 5.0–8.0)
Specific Gravity, Urine: >1.03 — ABNORMAL HIGH (ref 1.005–1.030)
Urobilinogen, UA: 0.2 mg/dL (ref 0.0–1.0)

## 2014-07-02 LAB — URINE MICROSCOPIC-ADD ON

## 2014-07-02 LAB — POC URINE PREG, ED: Preg Test, Ur: NEGATIVE

## 2014-07-02 MED ORDER — ONDANSETRON HCL 4 MG PO TABS: 4.0000 mg | ORAL_TABLET | Freq: Three times a day (TID) | ORAL | Status: DC | PRN

## 2014-07-02 MED ORDER — TRAMADOL HCL 50 MG PO TABS
50.0000 mg | ORAL_TABLET | Freq: Once | ORAL | Status: AC
  Administered 2014-07-02: 50 mg via ORAL
  Filled 2014-07-02: qty 1

## 2014-07-02 MED ORDER — ONDANSETRON 4 MG PO TBDP
4.0000 mg | ORAL_TABLET | Freq: Once | ORAL | Status: AC
  Administered 2014-07-02: 4 mg via ORAL
  Filled 2014-07-02: qty 1

## 2014-07-02 MED ORDER — PROMETHAZINE HCL 25 MG PO TABS: 25.0000 mg | ORAL_TABLET | Freq: Four times a day (QID) | ORAL | Status: DC | PRN

## 2014-07-02 MED ORDER — TRAMADOL HCL 50 MG PO TABS: 50.0000 mg | ORAL_TABLET | Freq: Four times a day (QID) | ORAL | Status: DC | PRN

## 2014-07-02 NOTE — ED Notes (Signed)
Pt c/o vomiting and diarrhea that started tonight

## 2014-07-02 NOTE — ED Provider Notes (Signed)
CSN: [id number]     Arrival date & time 07/02/14  0117 History   First MD Initiated Contact with Patient 07/02/14 0131     Chief Complaint  Patient presents with  . Emesis     (Consider location/radiation/quality/duration/timing/severity/associated sxs/prior Treatment) HPI Comments: 19 year old female, history of urinary tract infection and pregnancy, acid reflux. She presents with a complaint of abdominal discomfort with cramping in the epigastrium associated with nausea vomiting and diarrhea. This started approximately 2 hours ago, she has had multiple episodes of watery diarrhea as well as several episodes of emesis, this is nonbloody, not associated with fevers chills coughing shortness of breath back pain swelling of the legs rashes or dysuria. She denies being pregnant, has been on the Depakote shot for the last 6 months. Her significant other had similar symptoms at the same time. She reports eating at a hamburger restaurant this evening, ate a hamburger and french fries, no suspicious food intake.  Patient is a 19 y.o. female presenting with vomiting. The history is provided by the patient and a relative.  Emesis   Past Medical History  Diagnosis Date  . Swelling   . Bladder infection, chronic   . Supervision of normal pregnancy in first trimester 05/03/2013  . GERD (gastroesophageal reflux disease)   . Hypertension   . Renal disorder     chronic bladder infections   Past Surgical History  Procedure Laterality Date  . Tonsillectomy     Family History  Problem Relation Age of Onset  . Diabetes Paternal Grandfather   . Heart disease Paternal Grandfather   . Diabetes Paternal Grandmother   . Heart disease Paternal Grandmother   . Diabetes Maternal Grandmother   . Heart disease Maternal Grandmother   . Diabetes Maternal Grandfather   . Heart disease Maternal Grandfather   . Heart disease Mother    History  Substance Use Topics  . Smoking status: Current Every Day  Smoker -- 0.20 packs/day for 2 years    Types: Cigarettes    Last Attempt to Quit: 04/04/2013  . Smokeless tobacco: Former Neurosurgeon    Types: Chew  . Alcohol Use: No   OB History    Gravida Para Term Preterm AB TAB SAB Ectopic Multiple Living   0 0 0 0 0 0 1     Review of Systems  Gastrointestinal: Positive for vomiting.  All other systems reviewed and are negative.     Allergies  Aleve; Amoxicillin; and Tylenol  Home Medications   Prior to Admission medications   Medication Sig Start Date End Date Taking? Authorizing Provider  calcium carbonate (TUMS - DOSED IN MG ELEMENTAL CALCIUM) 500 MG chewable tablet Chew 1 tablet by mouth daily as needed for indigestion or heartburn.   Yes Historical Provider, MD  diphenhydrAMINE (BENADRYL) 25 mg capsule Take 50 mg by mouth every 6 (six) hours as needed. Allergies   Yes Historical Provider, MD  EPINEPHrine (EPI-PEN) 0.3 mg/0.3 mL DEVI Inject 0.3 mLs (0.3 mg total) into the muscle once. 01/27/12  Yes Kathie Dike, PA-C  ibuprofen (ADVIL,MOTRIN) 100 MG tablet Take 100 mg by mouth every 6 (six) hours as needed for fever.   Yes Historical Provider, MD  medroxyPROGESTERone (DEPO-PROVERA) 150 MG/ML injection Inject 1 mL (150 mg total) into the muscle every 3 (three) months. 04/14/14  Yes Lazaro Arms, MD  sulfamethoxazole-trimethoprim (BACTRIM DS,SEPTRA DS) 800-160 MG per tablet Take 1 tablet by mouth 2 (two) times daily. X 14 days 04/23/14  Yes Marge DuncansKimberly Randall Booker, CNM  ondansetron (ZOFRAN) 4 MG tablet Take 1 tablet (4 mg total) by mouth every 8 (eight) hours as needed for nausea or vomiting. 07/02/14   Vida RollerBrian D Kamoria Lucien, MD  Prenatal Vit-Fe Fumarate-FA (PRENATAL MULTIVITAMIN) TABS tablet Take 1 tablet by mouth daily at 12 noon.    Historical Provider, MD  promethazine (PHENERGAN) 25 MG tablet Take 1 tablet (25 mg total) by mouth every 6 (six) hours as needed for nausea or vomiting. 07/02/14   Vida RollerBrian D Jules Baty, MD  traMADol (ULTRAM) 50 MG tablet  Take 1 tablet (50 mg total) by mouth every 6 (six) hours as needed. 07/02/14   Vida RollerBrian D Haili Donofrio, MD   BP 124/70 mmHg  Pulse 97  Temp(Src) 97.5 F (36.4 C)  Resp 17  Ht 5\' 9"  (1.753 m)  Wt 250 lb (113.399 kg)  BMI 36.90 kg/m2  SpO2 98% Physical Exam  Constitutional: She appears well-developed and well-nourished. No distress.  HENT:  Head: Normocephalic and atraumatic.  Mouth/Throat: Oropharynx is clear and moist. No oropharyngeal exudate.  Eyes: Conjunctivae and EOM are normal. Pupils are equal, round, and reactive to light. Right eye exhibits no discharge. Left eye exhibits no discharge. No scleral icterus.  Neck: Normal range of motion. Neck supple. No JVD present. No thyromegaly present.  Cardiovascular: Normal rate, regular rhythm, normal heart sounds and intact distal pulses.  Exam reveals no gallop and no friction rub.   No murmur heard. Pulmonary/Chest: Effort normal and breath sounds normal. No respiratory distress. She has no wheezes. She has no rales.  Abdominal: Soft. Bowel sounds are normal. She exhibits no distension and no mass. There is tenderness (minimal epigastric discomfort with palpation, no guarding, no other abdominal tenderness, no peritoneal signs).  Musculoskeletal: Normal range of motion. She exhibits no edema or tenderness.  Lymphadenopathy:    She has no cervical adenopathy.  Neurological: She is alert. Coordination normal.  Skin: Skin is warm and dry. No rash noted. No erythema.  Psychiatric: She has a normal mood and affect. Her behavior is normal.  Nursing note and vitals reviewed.   ED Course  Procedures (including critical care time) Labs Review Labs Reviewed  URINALYSIS, ROUTINE W REFLEX MICROSCOPIC - Abnormal; Notable for the following:    Color, Urine AMBER (*)    APPearance HAZY (*)    Specific Gravity, Urine >1.030 (*)    Hgb urine dipstick MODERATE (*)    Bilirubin Urine SMALL (*)    Protein, ur TRACE (*)    Leukocytes, UA TRACE (*)    All  other components within normal limits  URINE MICROSCOPIC-ADD ON - Abnormal; Notable for the following:    Squamous Epithelial / LPF MANY (*)    Bacteria, UA MANY (*)    All other components within normal limits  URINE CULTURE  POC URINE PREG, ED    Imaging Review No results found.    MDM   Final diagnoses:  Gastroenteritis    Overall well-appearing, vital signs are totally normal, the patient appears well, she has ongoing nausea and abdominal cramping, check pregnancy, medications for nausea and pain. Patient stable for discharge on the following medications.  She has been warned that her symptoms will likely last 2 or 3 days.  Urine contaminated - meds given, U Cx ordered, stable for d/c.  Meds given in ED:  Medications  ondansetron (ZOFRAN-ODT) disintegrating tablet 4 mg (4 mg Oral Given 07/02/14 0140)  traMADol (ULTRAM) tablet 50 mg (50 mg Oral Given  07/02/14 0208)    New Prescriptions   ONDANSETRON (ZOFRAN) 4 MG TABLET    Take 1 tablet (4 mg total) by mouth every 8 (eight) hours as needed for nausea or vomiting.   PROMETHAZINE (PHENERGAN) 25 MG TABLET    Take 1 tablet (25 mg total) by mouth every 6 (six) hours as needed for nausea or vomiting.   TRAMADOL (ULTRAM) 50 MG TABLET    Take 1 tablet (50 mg total) by mouth every 6 (six) hours as needed.        Vida Roller, MD 07/02/14 [id number]

## 2014-07-02 NOTE — Discharge Instructions (Signed)
Please call your doctor for a followup appointment within 24-48 hours. When you talk to your doctor please let them know that you were seen in the emergency department and have them acquire all of your records so that they can discuss the findings with you and formulate a treatment plan to fully care for your new and ongoing problems. ° °

## 2014-07-03 LAB — URINE CULTURE

## 2014-07-23 ENCOUNTER — Ambulatory Visit (INDEPENDENT_AMBULATORY_CARE_PROVIDER_SITE_OTHER): Payer: Medicaid Other | Admitting: *Deleted

## 2014-07-23 ENCOUNTER — Encounter: Payer: Self-pay | Admitting: *Deleted

## 2014-07-23 DIAGNOSIS — Z3202 Encounter for pregnancy test, result negative: Secondary | ICD-10-CM

## 2014-07-23 DIAGNOSIS — Z3042 Encounter for surveillance of injectable contraceptive: Secondary | ICD-10-CM

## 2014-07-23 LAB — POCT URINE PREGNANCY: PREG TEST UR: NEGATIVE

## 2014-07-23 MED ORDER — MEDROXYPROGESTERONE ACETATE 150 MG/ML IM SUSP
150.0000 mg | Freq: Once | INTRAMUSCULAR | Status: AC
  Administered 2014-07-23: 150 mg via INTRAMUSCULAR

## 2014-07-23 NOTE — Progress Notes (Signed)
Pt here for Depo. Reports no problems at this time. Return in 12 weeks for next shot. JSY 

## 2014-07-24 ENCOUNTER — Ambulatory Visit: Payer: Medicaid Other

## 2014-08-06 ENCOUNTER — Ambulatory Visit (INDEPENDENT_AMBULATORY_CARE_PROVIDER_SITE_OTHER): Payer: Self-pay | Admitting: Advanced Practice Midwife

## 2014-08-06 ENCOUNTER — Encounter: Payer: Self-pay | Admitting: Advanced Practice Midwife

## 2014-08-06 VITALS — BP 120/58 | HR 80 | Ht 69.0 in | Wt 308.0 lb

## 2014-08-06 DIAGNOSIS — F39 Unspecified mood [affective] disorder: Secondary | ICD-10-CM

## 2014-08-06 DIAGNOSIS — F520 Hypoactive sexual desire disorder: Secondary | ICD-10-CM

## 2014-08-06 DIAGNOSIS — R4586 Emotional lability: Secondary | ICD-10-CM | POA: Insufficient documentation

## 2014-08-06 NOTE — Progress Notes (Signed)
[facility] Visit  Patient name: Suzanne Short MRN [id number]  Date of birth: 01-Apr-1996  CC & HPI:  Suzanne Short is a 19 y.o. Caucasian female presenting today for c/o no sex drive and moodiness since birth of child 7 months ago.  Uses depo (has used in past w/o problems).  Wondering if her "hormones" are messed up.  States that she "loves her BF to death" and doesn't have any issues with him.  Has not had ssex in months.  Feels moody, quick to "fly off the handle".  Does not want any "pills"  Pertinent History Reviewed:  Medical & Surgical Hx:   Past Medical History  Diagnosis Date  . Swelling   . Bladder infection, chronic   . Supervision of normal pregnancy in first trimester 05/03/2013  . GERD (gastroesophageal reflux disease)   . Hypertension   . Renal disorder     chronic bladder infections   Past Surgical History  Procedure Laterality Date  . Tonsillectomy     Family History  Problem Relation Age of Onset  . Diabetes Paternal Grandfather   . Heart disease Paternal Grandfather   . Diabetes Paternal Grandmother   . Heart disease Paternal Grandmother   . Diabetes Maternal Grandmother   . Heart disease Maternal Grandmother   . Diabetes Maternal Grandfather   . Heart disease Maternal Grandfather   . Heart disease Mother     Current outpatient prescriptions:  .  diphenhydrAMINE (BENADRYL) 25 mg capsule, Take 50 mg by mouth every 6 (six) hours as needed. Allergies, Disp: , Rfl:  .  EPINEPHrine (EPI-PEN) 0.3 mg/0.3 mL DEVI, Inject 0.3 mLs (0.3 mg total) into the muscle once. (Patient not taking: Reported on 08/06/2014), Disp: 1 Device, Rfl: 1 .  ibuprofen (ADVIL,MOTRIN) 100 MG tablet, Take 200 mg by mouth as needed for fever. , Disp: , Rfl:  .  medroxyPROGESTERone (DEPO-PROVERA) 150 MG/ML injection, Inject 1 mL (150 mg total) into the muscle every 3 (three) months. (Patient not taking: Reported on 08/06/2014), Disp: 1 mL, Rfl: 4 Social History: Reviewed -  reports that  she has been smoking Cigarettes.  She has a 20 pack-year smoking history. She has quit using smokeless tobacco. Her smokeless tobacco use included Chew.  Review of Systems:   Constitutional: Negative for fever and chills Eyes: Negative for visual disturbances Respiratory: Negative for shortness of breath, dyspnea Cardiovascular: Negative for chest pain or palpitations  Gastrointestinal: Negative for vomiting, diarrhea and constipation; no abdominal pain Genitourinary: Negative for dysuria and urgency, vaginal irritation or itching Musculoskeletal: Negative for back pain, joint pain, myalgias  Neurological: Negative for dizziness and headaches    Objective Findings:  Vitals: BP 120/58 mmHg  Pulse 80  Ht 5\' 9"  (1.753 m)  Wt 308 lb (139.708 kg)  BMI 45.46 kg/m2  Physical Examination: General appearance - alert, well appearing, and in no distress Mental status - alert, oriented to person, place, and time Neurological - alert, oriented, normal speech, no focal findings or movement disorder noted     Assessment & Plan:  A:   Hypoactive sexual desire and moodiness, possilbly r/t recent childbirth and/or depo. P:  Will not renew depo in June.  Plan paragard in May.  Offered Addyi, declined.  Will try to have sex more often with partner (gym analogy).  Will wait 10 seconds bt thinking and verbalizing "snappy thoughts"l  CRESENZO-DISHMAN,Shawana Knoch CNM 08/06/2014 4:18 PM

## 2014-09-17 ENCOUNTER — Ambulatory Visit (INDEPENDENT_AMBULATORY_CARE_PROVIDER_SITE_OTHER): Payer: Medicaid Other | Admitting: Advanced Practice Midwife

## 2014-09-17 ENCOUNTER — Encounter: Payer: Self-pay | Admitting: Advanced Practice Midwife

## 2014-09-17 ENCOUNTER — Ambulatory Visit: Payer: Medicaid Other | Admitting: Advanced Practice Midwife

## 2014-09-17 VITALS — BP 118/70 | HR 76 | Ht 69.0 in | Wt 302.4 lb

## 2014-09-17 DIAGNOSIS — Z30014 Encounter for initial prescription of intrauterine contraceptive device: Secondary | ICD-10-CM

## 2014-09-17 DIAGNOSIS — Z30430 Encounter for insertion of intrauterine contraceptive device: Secondary | ICD-10-CM

## 2014-09-18 DIAGNOSIS — Z3009 Encounter for other general counseling and advice on contraception: Secondary | ICD-10-CM | POA: Insufficient documentation

## 2014-09-18 NOTE — Progress Notes (Signed)
Suzanne HeinzJessica Short is a 19 y.o. year old Caucasian female  who presents for placement of a paragard IUD. She has not had sex in months and her pregnancy test today is negative.    The risks and benefits of the method and placement have been thouroughly reviewed with the patient and all questions were answered.  Specifically the patient is aware of failure rate of 05/998, expulsion of the IUD and of possible perforation.  Time out was performed.  A Graves speculum was placed.  The cervix was prepped using Betadine. The uterus was found to be neutral and it sounded to 8 cm.  The cervix was grasped with a tenaculum and the IUD was inserted to 8 cm.  It was pulled back 1 cm and the IUD was disengaged.  The strings were trimmed to 3 cm.  Sonogram was performed and the proper placement of the IUD was verified.  The patient was instructed on signs and symptoms of infection and to check for the strings after each menses or each month.  The patient is to refrain from intercourse for 3 days.  The patient is scheduled for a return appointment after her first menses or 4 weeks.  CRESENZO-DISHMAN,Rayni Nemitz 09/18/2014 12:57 PM

## 2014-10-15 ENCOUNTER — Ambulatory Visit (INDEPENDENT_AMBULATORY_CARE_PROVIDER_SITE_OTHER): Payer: Medicaid Other | Admitting: Advanced Practice Midwife

## 2014-10-15 ENCOUNTER — Ambulatory Visit: Payer: Medicaid Other | Admitting: Advanced Practice Midwife

## 2014-10-15 ENCOUNTER — Ambulatory Visit: Payer: Medicaid Other

## 2014-10-15 ENCOUNTER — Encounter: Payer: Self-pay | Admitting: Advanced Practice Midwife

## 2014-10-15 VITALS — BP 122/60 | HR 96 | Ht 69.0 in | Wt 310.5 lb

## 2014-10-15 DIAGNOSIS — Z30431 Encounter for routine checking of intrauterine contraceptive device: Secondary | ICD-10-CM

## 2014-10-15 NOTE — Progress Notes (Signed)
[facility]  Patient name: Suzanne HeinzJessica Short MRN [id number]  Date of birth: 25-Sep-1995  CC & HPI:  Suzanne Short is a 19 y.o. Caucasian female presenting today for IUD check. She had paragard placed 4 weeks ago.  Was on depo, but next shot would be due in 2 weeks.  Spots a little, but it is safe to assum ethat depo is still in her system. No problems with IUD. Has not had sex yet  Pertinent History Reviewed:  Medical & Surgical Hx:   Past Medical History  Diagnosis Date  . Swelling   . Bladder infection, chronic   . Supervision of normal pregnancy in first trimester 05/03/2013  . GERD (gastroesophageal reflux disease)   . Hypertension   . Renal disorder     chronic bladder infections   Past Surgical History  Procedure Laterality Date  . Tonsillectomy     Family History  Problem Relation Age of Onset  . Diabetes Paternal Grandfather   . Heart disease Paternal Grandfather   . Diabetes Paternal Grandmother   . Heart disease Paternal Grandmother   . Diabetes Maternal Grandmother   . Heart disease Maternal Grandmother   . Diabetes Maternal Grandfather   . Heart disease Maternal Grandfather   . Heart disease Mother     Current outpatient prescriptions:  .  diphenhydrAMINE (BENADRYL) 25 mg capsule, Take 50 mg by mouth every 6 (six) hours as needed. Allergies, Disp: , Rfl:  .  EPINEPHrine (EPI-PEN) 0.3 mg/0.3 mL DEVI, Inject 0.3 mLs (0.3 mg total) into the muscle once., Disp: 1 Device, Rfl: 1 .  ibuprofen (ADVIL,MOTRIN) 100 MG tablet, Take 200 mg by mouth as needed. , Disp: , Rfl:  .  PARAGARD INTRAUTERINE COPPER IU, by Intrauterine route., Disp: , Rfl:  Social History: Reviewed -  reports that she quit smoking about 18 months ago. Her smoking use included Cigarettes. She has a 20 pack-year smoking history. She has quit using smokeless tobacco. Her smokeless tobacco use included Chew.  Review of Systems:   Constitutional: Negative for fever and chills Eyes: Negative for  visual disturbances Respiratory: Negative for shortness of breath, dyspnea Cardiovascular: Negative for chest pain or palpitations  Gastrointestinal: Negative for vomiting, diarrhea and constipation; no abdominal pain Genitourinary: Negative for dysuria and urgency, vaginal irritation or itching Musculoskeletal: Negative for back pain, joint pain, myalgias  Neurological: Negative for dizziness and headaches    Objective Findings:  Vitals: BP 122/60 mmHg  Pulse 96  Ht 5\' 9"  (1.753 m)  Wt 310 lb 8 oz (140.842 kg)  BMI 45.83 kg/m2  LMP 09/14/2014  Breastfeeding? No  Physical Examination: General appearance - alert, well appearing, and in no distress Mental status - alert, oriented to person, place, and time Pelvic - SSE:  Normal dc. IUD strings present  No results found for this or any previous visit (from the past 24 hour(s)).      Assessment & Plan:  A:   IUD check P:  Try to check for strings q month after a period (or have husband check CRESENZO-DISHMAN,Tamani Durney CNM 10/15/2014 4:09 PM

## 2015-03-24 ENCOUNTER — Ambulatory Visit (INDEPENDENT_AMBULATORY_CARE_PROVIDER_SITE_OTHER): Payer: Medicaid Other | Admitting: Advanced Practice Midwife

## 2015-03-24 ENCOUNTER — Encounter: Payer: Self-pay | Admitting: Advanced Practice Midwife

## 2015-03-24 VITALS — BP 120/76 | Ht 69.0 in | Wt 291.0 lb

## 2015-03-24 DIAGNOSIS — N939 Abnormal uterine and vaginal bleeding, unspecified: Secondary | ICD-10-CM

## 2015-03-24 DIAGNOSIS — Z975 Presence of (intrauterine) contraceptive device: Secondary | ICD-10-CM

## 2015-03-24 DIAGNOSIS — N921 Excessive and frequent menstruation with irregular cycle: Secondary | ICD-10-CM

## 2015-03-24 DIAGNOSIS — Z309 Encounter for contraceptive management, unspecified: Secondary | ICD-10-CM | POA: Diagnosis not present

## 2015-03-24 MED ORDER — MEGESTROL ACETATE 40 MG PO TABS: 40.0000 mg | ORAL_TABLET | Freq: Every day | ORAL | Status: DC

## 2015-03-24 NOTE — Progress Notes (Addendum)
[facility] Visit  Patient name: Suzanne Short MRN [id number]  Date of birth: 01-30-1996  CC & HPI:  Suzanne Short is a 19 y.o. Caucasian female presenting today for IUD check. She had Paragard placed in May, last depo shot in March.  Felt Depo could be lowering sex drive and making her moody. States had been having regular monthly bleeding, ~ 7 days.  She has had bleeding 9/28 until 2 days ago. ? Whether depo is still in her system. Feels better emotionally and is having more sex.   Pertinent History Reviewed:  Medical & Surgical Hx:   Past Medical History  Diagnosis Date  . Swelling   . Bladder infection, chronic   . Supervision of normal pregnancy in first trimester 05/03/2013  . GERD (gastroesophageal reflux disease)   . Hypertension   . Renal disorder     chronic bladder infections   Past Surgical History  Procedure Laterality Date  . Tonsillectomy     Family History  Problem Relation Age of Onset  . Diabetes Paternal Grandfather   . Heart disease Paternal Grandfather   . Diabetes Paternal Grandmother   . Heart disease Paternal Grandmother   . Diabetes Maternal Grandmother   . Heart disease Maternal Grandmother   . Diabetes Maternal Grandfather   . Heart disease Maternal Grandfather   . Heart disease Mother     Current outpatient prescriptions:  .  diphenhydrAMINE (BENADRYL) 25 mg capsule, Take 50 mg by mouth every 6 (six) hours as needed. Allergies, Disp: , Rfl:  .  ibuprofen (ADVIL,MOTRIN) 100 MG tablet, Take 200 mg by mouth as needed. , Disp: , Rfl:  .  PARAGARD INTRAUTERINE COPPER IU, by Intrauterine route., Disp: , Rfl:  .  EPINEPHrine (EPI-PEN) 0.3 mg/0.3 mL DEVI, Inject 0.3 mLs (0.3 mg total) into the muscle once. (Patient not taking: Reported on 03/24/2015), Disp: 1 Device, Rfl: 1 .  megestrol (MEGACE) 40 MG tablet, Take 1 tablet (40 mg total) by mouth daily. 1-3 a day PO prn bleeding, Disp: 60 tablet, Rfl: 2 Social History: Reviewed -  reports that she  quit smoking about 1 years ago. Her smoking use included Cigarettes. She has a 20 pack-year smoking history. She has quit using smokeless tobacco. Her smokeless tobacco use included Chew.  Review of Systems:   Constitutional: Negative for fever and chills Eyes: Negative for visual disturbances Respiratory: Negative for shortness of breath, dyspnea Cardiovascular: Negative for chest pain or palpitations  Gastrointestinal: Negative for vomiting, diarrhea and constipation; no abdominal pain Genitourinary: Negative for dysuria and urgency, vaginal irritation or itching Musculoskeletal: Negative for back pain, joint pain, myalgias  Neurological: Negative for dizziness and headaches    Objective Findings:  Vitals: BP 120/76 mmHg  Ht  (1.753 m)  Wt 291 lb (131.997 kg)  BMI 42.95 kg/m2  LMP 02/06/2015  Breastfeeding? No  Physical Examination: General appearance - well appearing, and in no distress Mental status - alert, oriented to person, place, and time Chest:  Normal respiratory effort Heart - normal rate and regular rhythm Abdomen:  Soft, nontender Pelvic: Small spotting,  Beside US reveals IUD in fundus.  Musculoskeletal:  Normal range of motion without pain Extremities:  No edema    No results found for this or any previous visit (from the past 24 hour(s)).    Assessment & Plan:  A:   AUB, probable SE of Depo P:  Megace given for prn use   Return if symptoms worsen or  fail to improve.  CRESENZO-DISHMAN,Zian Delair CNM 03/24/2015 10:42 AM

## 2015-04-07 ENCOUNTER — Ambulatory Visit (INDEPENDENT_AMBULATORY_CARE_PROVIDER_SITE_OTHER): Payer: Medicaid Other | Admitting: *Deleted

## 2015-04-07 ENCOUNTER — Encounter: Payer: Self-pay | Admitting: Advanced Practice Midwife

## 2015-04-07 ENCOUNTER — Ambulatory Visit (INDEPENDENT_AMBULATORY_CARE_PROVIDER_SITE_OTHER): Payer: Medicaid Other | Admitting: Advanced Practice Midwife

## 2015-04-07 ENCOUNTER — Encounter: Payer: Self-pay | Admitting: *Deleted

## 2015-04-07 VITALS — BP 100/72 | Ht 69.0 in

## 2015-04-07 DIAGNOSIS — Z3200 Encounter for pregnancy test, result unknown: Secondary | ICD-10-CM

## 2015-04-07 DIAGNOSIS — Z30432 Encounter for removal of intrauterine contraceptive device: Secondary | ICD-10-CM

## 2015-04-07 DIAGNOSIS — Z3042 Encounter for surveillance of injectable contraceptive: Secondary | ICD-10-CM

## 2015-04-07 DIAGNOSIS — Z309 Encounter for contraceptive management, unspecified: Secondary | ICD-10-CM | POA: Diagnosis not present

## 2015-04-07 DIAGNOSIS — Z3049 Encounter for surveillance of other contraceptives: Secondary | ICD-10-CM

## 2015-04-07 LAB — POCT URINE PREGNANCY: PREG TEST UR: NEGATIVE

## 2015-04-07 MED ORDER — MEDROXYPROGESTERONE ACETATE 150 MG/ML IM SUSP
150.0000 mg | Freq: Once | INTRAMUSCULAR | Status: AC
  Administered 2015-04-07: 150 mg via INTRAMUSCULAR

## 2015-04-07 MED ORDER — MEDROXYPROGESTERONE ACETATE 150 MG/ML IM SUSP: 150.0000 mg | INTRAMUSCULAR | Status: DC

## 2015-04-07 NOTE — Progress Notes (Signed)
  Suzanne BumpsJessica Paiz 19 y.o.  Filed Vitals:   04/07/15 1521  BP: 100/72   Past Medical History  Diagnosis Date  . Swelling   . Bladder infection, chronic   . Supervision of normal pregnancy in first trimester 05/03/2013  . GERD (gastroesophageal reflux disease)   . Hypertension   . Renal disorder     chronic bladder infections   Past Surgical History  Procedure Laterality Date  . Tonsillectomy     family history includes Diabetes in her maternal grandfather, maternal grandmother, paternal grandfather, and paternal grandmother; Heart disease in her maternal grandfather, maternal grandmother, mother, paternal grandfather, and paternal grandmother.  Current outpatient prescriptions:  .  diphenhydrAMINE (BENADRYL) 25 mg capsule, Take 50 mg by mouth every 6 (six) hours as needed. Allergies, Disp: , Rfl:  .  EPINEPHrine (EPI-PEN) 0.3 mg/0.3 mL DEVI, Inject 0.3 mLs (0.3 mg total) into the muscle once. (Patient not taking: Reported on 03/24/2015), Disp: 1 Device, Rfl: 1 .  ibuprofen (ADVIL,MOTRIN) 100 MG tablet, Take 200 mg by mouth as needed. , Disp: , Rfl:  .  medroxyPROGESTERone (DEPO-PROVERA) 150 MG/ML injection, Inject 1 mL (150 mg total) into the muscle every 3 (three) months., Disp: 1 mL, Rfl: 3 .  megestrol (MEGACE) 40 MG tablet, Take 1 tablet (40 mg total) by mouth daily. 1-3 a day PO prn bleeding, Disp: 60 tablet, Rfl: 2 .  PARAGARD INTRAUTERINE COPPER IU, by Intrauterine route., Disp: , Rfl:     Here for IUD removal.  She had the Paragard IUD placed in May d/t perceived SE of depo (decreasing sex drive, emotional) and would like it removed . She and her husband split up, so that could have been (!) responsible.  She wants to go back on depo  A graves speculum was placed, and the strings were visible.  They were grasped with a curved Tresa EndoKelly and the IUD easily removed.  Pt given IUD removal f/u instructions.  Depo sent to pharmacy, will come back

## 2015-04-07 NOTE — Progress Notes (Signed)
Pt here for Depo. Reports no problems at this time. Pt was seen earlier today and had a negative pregnancy test. Return in 12 weeks for next shot. JSY

## 2015-04-29 ENCOUNTER — Telehealth: Payer: Self-pay | Admitting: *Deleted

## 2015-04-29 NOTE — Telephone Encounter (Signed)
Pt Mother, Pablo LedgerMarvette, states pt is having an allergic reaction, whelps on arms and hands, thinks it is due to the Depo provera injection she got on 04/07/2015. Informed did not believe was related to Depo since she got the Depo on 11/22 and has been getting the depo injection for several months. Also, states pt took 2 Benadryl and has not helped, advised to go to ER if doesn't have improvement. Pt Mother, Pablo LedgerMarvette, verbalized understanding.

## 2015-06-30 ENCOUNTER — Encounter: Payer: Self-pay | Admitting: *Deleted

## 2015-06-30 ENCOUNTER — Ambulatory Visit: Payer: Medicaid Other

## 2015-07-06 ENCOUNTER — Observation Stay (HOSPITAL_COMMUNITY)
Admission: EM | Admit: 2015-07-06 | Discharge: 2015-07-08 | Disposition: A | Discharge status: Home / Self Care | Payer: Medicaid Other | Attending: Internal Medicine | Admitting: Internal Medicine

## 2015-07-06 ENCOUNTER — Encounter (HOSPITAL_COMMUNITY): Payer: Self-pay | Admitting: Emergency Medicine

## 2015-07-06 ENCOUNTER — Ambulatory Visit: Payer: Medicaid Other

## 2015-07-06 DIAGNOSIS — F1290 Cannabis use, unspecified, uncomplicated: Secondary | ICD-10-CM | POA: Diagnosis present

## 2015-07-06 DIAGNOSIS — T783XXA Angioneurotic edema, initial encounter: Principal | ICD-10-CM | POA: Insufficient documentation

## 2015-07-06 DIAGNOSIS — T39315A Adverse effect of propionic acid derivatives, initial encounter: Secondary | ICD-10-CM | POA: Insufficient documentation

## 2015-07-06 DIAGNOSIS — Z87891 Personal history of nicotine dependence: Secondary | ICD-10-CM | POA: Diagnosis not present

## 2015-07-06 DIAGNOSIS — Z886 Allergy status to analgesic agent status: Secondary | ICD-10-CM | POA: Insufficient documentation

## 2015-07-06 DIAGNOSIS — K219 Gastro-esophageal reflux disease without esophagitis: Secondary | ICD-10-CM | POA: Insufficient documentation

## 2015-07-06 DIAGNOSIS — L509 Urticaria, unspecified: Secondary | ICD-10-CM | POA: Diagnosis present

## 2015-07-06 DIAGNOSIS — Z6841 Body Mass Index (BMI) 40.0 and over, adult: Secondary | ICD-10-CM | POA: Insufficient documentation

## 2015-07-06 DIAGNOSIS — Z880 Allergy status to penicillin: Secondary | ICD-10-CM | POA: Insufficient documentation

## 2015-07-06 MED ORDER — RACEPINEPHRINE HCL 2.25 % IN NEBU: 0.5000 mL | INHALATION_SOLUTION | Freq: Once | RESPIRATORY_TRACT | Status: DC

## 2015-07-06 MED ORDER — EPINEPHRINE 0.3 MG/0.3ML IJ SOAJ
INTRAMUSCULAR | Status: AC
  Administered 2015-07-06: 23:00:00
  Filled 2015-07-06: qty 0.3

## 2015-07-06 MED ORDER — DIPHENHYDRAMINE HCL 50 MG/ML IJ SOLN
50.0000 mg | Freq: Once | INTRAMUSCULAR | Status: AC
  Administered 2015-07-06: 50 mg via INTRAVENOUS

## 2015-07-06 MED ORDER — RACEPINEPHRINE HCL 2.25 % IN NEBU
INHALATION_SOLUTION | RESPIRATORY_TRACT | Status: AC
Start: 2015-07-06 — End: 2015-07-06
  Administered 2015-07-06: 23:00:00
  Filled 2015-07-06: qty 0.5

## 2015-07-06 MED ORDER — METHYLPREDNISOLONE SODIUM SUCC 125 MG IJ SOLR
125.0000 mg | Freq: Once | INTRAMUSCULAR | Status: AC
  Administered 2015-07-06: 125 mg via INTRAVENOUS

## 2015-07-06 MED ORDER — FAMOTIDINE IN NACL 20-0.9 MG/50ML-% IV SOLN
INTRAVENOUS | Status: AC
  Filled 2015-07-06: qty 50

## 2015-07-06 MED ORDER — FAMOTIDINE IN NACL 20-0.9 MG/50ML-% IV SOLN
20.0000 mg | Freq: Once | INTRAVENOUS | Status: AC
  Administered 2015-07-06: 20 mg via INTRAVENOUS

## 2015-07-06 MED ORDER — DIPHENHYDRAMINE HCL 50 MG/ML IJ SOLN
INTRAMUSCULAR | Status: AC
  Filled 2015-07-06: qty 1

## 2015-07-06 MED ORDER — METHYLPREDNISOLONE SODIUM SUCC 125 MG IJ SOLR
INTRAMUSCULAR | Status: AC
  Filled 2015-07-06: qty 2

## 2015-07-06 NOTE — ED Notes (Signed)
Dr. James at bedside  

## 2015-07-06 NOTE — ED Provider Notes (Signed)
CSN: [id number]     Arrival date & time 07/06/15  2206 History  By signing my name below, I, Linus Galas, attest that this documentation has been prepared under the direction and in the presence of Rolland Porter, MD. Electronically Signed: Linus Galas, ED Scribe. 07/07/2015. 10:20 PM.   Chief Complaint  Patient presents with  . Allergic Reaction    The history is provided by the patient and a parent. No language interpreter was used.   HPI Comments: Suzanne Short is a 20 y.o. female with a PMHx of GERD, HTN, and renal disorder who presents to the Emergency Department for an evalaution of a possible allergic reaction PTA. Mother reports that the pt has upper and lower lip swelling, tongue swelling, and body rashes. Pt has had these symptoms for more than 30 minutes. Pt took allegra and 2 bendryl PTA with no relief. Pt has had lip swelling and rashes before but never has had tongue swelling. Mother reports the pt did not have any episodes during her pregnancy. However, before and after her pregnancy she has had several episodes. Mother is unaware what is causing these symptoms. Mother has tried different soaps, linen, etc but the pt continues to have allergic reactions. Pt has not seen an allergist. Pt  denies SOB or any other symptoms at this time.  Recent did take Aleve at home tonight. Has taken this in the past without immediate reaction.  Past Medical History  Diagnosis Date  . Swelling   . Bladder infection, chronic   . Supervision of normal pregnancy in first trimester 05/03/2013  . GERD (gastroesophageal reflux disease)   . Hypertension   . Renal disorder     chronic bladder infections   Past Surgical History  Procedure Laterality Date  . Tonsillectomy     Family History  Problem Relation Age of Onset  . Diabetes Paternal Grandfather   . Heart disease Paternal Grandfather   . Diabetes Paternal Grandmother   . Heart disease Paternal Grandmother   . Diabetes Maternal  Grandmother   . Heart disease Maternal Grandmother   . Diabetes Maternal Grandfather   . Heart disease Maternal Grandfather   . Heart disease Mother    Social History  Substance Use Topics  . Smoking status: Former Smoker -- 10.00 packs/day for 2 years    Types: Cigarettes    Quit date: 04/04/2013  . Smokeless tobacco: Former Neurosurgeon    Types: Chew  . Alcohol Use: No   OB History    Gravida Para Term Preterm AB TAB SAB Ectopic Multiple Living   0 0 0 0 0 0 1     Review of Systems  Constitutional: Negative for fever, chills, diaphoresis, appetite change and fatigue.  HENT: Negative for mouth sores, sore throat and trouble swallowing.   Eyes: Negative for visual disturbance.  Respiratory: Negative for cough, chest tightness, shortness of breath and wheezing.   Cardiovascular: Negative for chest pain.  Gastrointestinal: Negative for nausea, vomiting, abdominal pain, diarrhea and abdominal distention.  Endocrine: Negative for polydipsia, polyphagia and polyuria.  Genitourinary: Negative for dysuria, frequency and hematuria.  Musculoskeletal: Negative for gait problem.  Skin: Positive for rash. Negative for color change and pallor.       +lip swelling; + tongue swelling  Neurological: Negative for dizziness, syncope, light-headedness and headaches.  Hematological: Does not bruise/bleed easily.  Psychiatric/Behavioral: Negative for behavioral problems and confusion.  All other systems reviewed and are negative.   Allergies  Aleve;  Amoxicillin; and Tylenol  Home Medications   Prior to Admission medications   Medication Sig Start Date End Date Taking? Authorizing Provider  diphenhydrAMINE (BENADRYL) 25 mg capsule Take 50 mg by mouth every 6 (six) hours as needed. Allergies   Yes Historical Provider, MD  fexofenadine (ALLEGRA) 180 MG tablet Take 180 mg by mouth daily as needed for allergies or rhinitis.   Yes Historical Provider, MD  medroxyPROGESTERone (DEPO-PROVERA) 150  MG/ML injection Inject 1 mL (150 mg total) into the muscle every 3 (three) months. 04/07/15  Yes Jacklyn Shell, CNM  naproxen sodium (ALEVE) 220 MG tablet Take 440 mg by mouth daily as needed (for pain).   Yes Historical Provider, MD  EPINEPHrine (EPI-PEN) 0.3 mg/0.3 mL DEVI Inject 0.3 mLs (0.3 mg total) into the muscle once. 01/27/12   Ivery Quale, PA-C   BP 120/58 mmHg  Pulse 74  Temp(Src) 98.2 F (36.8 C) (Oral)  Resp 25  SpO2 98%   Physical Exam  Constitutional: She is oriented to person, place, and time. She appears well-developed and well-nourished. No distress.  HENT:  Head: Normocephalic.  marked angio edma with marked swelling to lips and tongue. Tongue is not protruding out of the mouth. I can visualize her posterior pharynx. Her uvula is midline.  Eyes: Conjunctivae are normal. Pupils are equal, round, and reactive to light. No scleral icterus.  Neck: Normal range of motion. Neck supple. No thyromegaly present.  Cardiovascular: Normal rate and regular rhythm.  Exam reveals no gallop and no friction rub.   No murmur heard. Pulmonary/Chest: Effort normal and breath sounds normal. No respiratory distress. She has no wheezes. She has no rales.  Lungs. No stridor. No wheezing rales or rhonchi. Patient is able to lay supine without apprehension  Abdominal: Soft. Bowel sounds are normal. She exhibits no distension. There is no tenderness. There is no rebound.  Musculoskeletal: Normal range of motion.  Neurological: She is alert and oriented to person, place, and time.  Skin: Skin is warm and dry.  Diffuse urticarial rash  Psychiatric: She has a normal mood and affect. Her behavior is normal.  Nursing note and vitals reviewed.   ED Course  Procedures   DIAGNOSTIC STUDIES: Oxygen Saturation is 100% on room air, normal by my interpretation.    COORDINATION OF CARE: 10:20 PM Will give Benadryl, Pepcid, Epi-Pen, and breathing treatment. Discussed treatment plan with  pt at bedside and pt agreed to plan.  MDM   Final diagnoses:  Urticaria  Angioedema, initial encounter   Patient given epinephrine 0.3 mg subcutaneous immediately after completion of my evaluation. IV is established. Given Solu- Medrol, Pepcid, Benadryl. Given racemic epinephrine nebulized. Subjectively she feels improvement. Continues to have marked angioedema for upper and lower lip. Her tongue is edematous, but appears to be somewhat improved. Posterior pharynx remains uninvolved. Her rash has improved. She is able to lay supine. Not hypoxemic. Not hemolytically unstable.  CRITICAL CARE Performed by: Rolland Porter JOSEPH   Total critical care time: 45 minutes  Critical care time was exclusive of separately billable procedures and treating other patients.  Critical care was necessary to treat or prevent imminent or life-threatening deterioration.  Critical care was time spent personally by me on the following activities: development of treatment plan with patient and/or surrogate as well as nursing, discussions with consultants, evaluation of patient's response to treatment, examination of patient, obtaining history from patient or surrogate, ordering and performing treatments and interventions, ordering and review of laboratory studies, ordering and  review of radiographic studies, pulse oximetry and re-evaluation of patient's condition. care  I personally performed the services described in this documentation, which was scribed in my presence. The recorded information has been reviewed and is accurate.    Rolland Porter, MD 07/07/15 [phone]

## 2015-07-06 NOTE — ED Notes (Signed)
Patient states she feels the same. Denies feeling worse. States her tounge and lips feel swollen but not throat. Respirations even and unlabored. NAD noted.

## 2015-07-06 NOTE — ED Notes (Signed)
Pt has swelling to mouth, tongue, and eyes.

## 2015-07-07 ENCOUNTER — Encounter (HOSPITAL_COMMUNITY): Payer: Self-pay | Admitting: Emergency Medicine

## 2015-07-07 ENCOUNTER — Other Ambulatory Visit: Payer: Medicaid Other | Admitting: Adult Health

## 2015-07-07 DIAGNOSIS — K219 Gastro-esophageal reflux disease without esophagitis: Secondary | ICD-10-CM

## 2015-07-07 DIAGNOSIS — L509 Urticaria, unspecified: Secondary | ICD-10-CM | POA: Diagnosis present

## 2015-07-07 DIAGNOSIS — F1210 Cannabis abuse, uncomplicated: Secondary | ICD-10-CM

## 2015-07-07 DIAGNOSIS — T783XXD Angioneurotic edema, subsequent encounter: Secondary | ICD-10-CM

## 2015-07-07 DIAGNOSIS — T783XXA Angioneurotic edema, initial encounter: Principal | ICD-10-CM

## 2015-07-07 LAB — CBC WITH DIFFERENTIAL/PLATELET
Basophils Absolute: 0 10*3/uL (ref 0.0–0.1)
Basophils Relative: 0 %
EOS ABS: 0 10*3/uL (ref 0.0–0.7)
Eosinophils Relative: 0 %
HCT: 41.4 % (ref 36.0–46.0)
HEMOGLOBIN: 13.9 g/dL (ref 12.0–15.0)
LYMPHS ABS: 1 10*3/uL (ref 0.7–4.0)
Lymphocytes Relative: 8 %
MCH: 28.1 pg (ref 26.0–34.0)
MCHC: 33.6 g/dL (ref 30.0–36.0)
MCV: 83.6 fL (ref 78.0–100.0)
MONOS PCT: 1 %
Monocytes Absolute: 0.2 10*3/uL (ref 0.1–1.0)
NEUTROS PCT: 91 %
Neutro Abs: 11.3 10*3/uL — ABNORMAL HIGH (ref 1.7–7.7)
Platelets: 259 10*3/uL (ref 150–400)
RBC: 4.95 MIL/uL (ref 3.87–5.11)
RDW: 14 % (ref 11.5–15.5)
WBC: 12.4 10*3/uL — ABNORMAL HIGH (ref 4.0–10.5)

## 2015-07-07 LAB — TSH: TSH: 0.421 u[IU]/mL (ref 0.350–4.500)

## 2015-07-07 LAB — COMPREHENSIVE METABOLIC PANEL
ALBUMIN: 3.5 g/dL (ref 3.5–5.0)
ALK PHOS: 80 U/L (ref 38–126)
ALT: 14 U/L (ref 14–54)
ANION GAP: 9 (ref 5–15)
AST: 22 U/L (ref 15–41)
BUN: 13 mg/dL (ref 6–20)
CALCIUM: 8.9 mg/dL (ref 8.9–10.3)
CO2: 21 mmol/L — AB (ref 22–32)
CREATININE: 0.8 mg/dL (ref 0.44–1.00)
Chloride: 109 mmol/L (ref 101–111)
GFR calc Af Amer: >60 mL/min (ref >60)
GFR calc non Af Amer: >60 mL/min (ref >60)
GLUCOSE: 210 mg/dL — AB (ref 65–99)
Potassium: 4 mmol/L (ref 3.5–5.1)
SODIUM: 139 mmol/L (ref 135–145)
Total Bilirubin: 0.7 mg/dL (ref 0.3–1.2)
Total Protein: 6.5 g/dL (ref 6.5–8.1)

## 2015-07-07 MED ORDER — NICOTINE 21 MG/24HR TD PT24
21.0000 mg | MEDICATED_PATCH | Freq: Every day | TRANSDERMAL | Status: DC
  Administered 2015-07-08 (×2): 21 mg via TRANSDERMAL
  Filled 2015-07-07 (×2): qty 1

## 2015-07-07 MED ORDER — ONDANSETRON HCL 4 MG PO TABS: 4.0000 mg | ORAL_TABLET | Freq: Four times a day (QID) | ORAL | Status: DC | PRN

## 2015-07-07 MED ORDER — DIPHENHYDRAMINE HCL 50 MG/ML IJ SOLN
25.0000 mg | Freq: Three times a day (TID) | INTRAMUSCULAR | Status: DC | PRN
  Administered 2015-07-07 – 2015-07-08 (×2): 25 mg via INTRAVENOUS
  Filled 2015-07-07 (×2): qty 1

## 2015-07-07 MED ORDER — FAMOTIDINE 20 MG PO TABS
40.0000 mg | ORAL_TABLET | Freq: Every day | ORAL | Status: DC
  Administered 2015-07-07: 40 mg via ORAL
  Filled 2015-07-07: qty 2

## 2015-07-07 MED ORDER — ENOXAPARIN SODIUM 40 MG/0.4ML ~~LOC~~ SOLN
40.0000 mg | SUBCUTANEOUS | Status: DC
  Administered 2015-07-07 – 2015-07-08 (×2): 40 mg via SUBCUTANEOUS
  Filled 2015-07-07 (×2): qty 0.4

## 2015-07-07 MED ORDER — PANTOPRAZOLE SODIUM 40 MG PO TBEC
40.0000 mg | DELAYED_RELEASE_TABLET | Freq: Every day | ORAL | Status: DC
  Administered 2015-07-07 – 2015-07-08 (×2): 40 mg via ORAL
  Filled 2015-07-07 (×2): qty 1

## 2015-07-07 MED ORDER — METHYLPREDNISOLONE SODIUM SUCC 125 MG IJ SOLR
125.0000 mg | Freq: Once | INTRAMUSCULAR | Status: AC
  Administered 2015-07-07: 125 mg via INTRAVENOUS
  Filled 2015-07-07: qty 2

## 2015-07-07 MED ORDER — ONDANSETRON HCL 4 MG/2ML IJ SOLN: 4.0000 mg | Freq: Four times a day (QID) | INTRAMUSCULAR | Status: DC | PRN

## 2015-07-07 NOTE — ED Notes (Signed)
Pt states the swelling is down.  "my tongue is normal now and I can talk a lot better".  Lips continue to be swollen.  Pt no distress. Ambulatory to bathroom.

## 2015-07-07 NOTE — Progress Notes (Signed)
[facility] PROGRESS NOTE  Tirza Senteno OZH:[id number] DOB: 25-Jul-1995 DOA: 07/06/2015 PCP: Colette Ribas, MD  Assessment/Plan: 1. Angioedema -[name] presenting with complaints of swelling involving tongue and lips as well as urticardia -In her history she reported taking Aleve after which symptoms started, raising the possibility of NSAID therapy triggering angioedema with urticadia.  -On exam she does not have evidence of airway compromise -Plan to continue treatment with diphenhydramine, famotidine, systemic steroids.  -Check C4 level, depressed level could be associated with C1 Inhibitor deficiency   Code Status: Full Code Family Communication:  Disposition Plan: Anticipate discharge home when stable   HPI/Subjective: [name] is a 20 year old with a history of morbid obesity admitted to the medicine service this morning when she presented to the emergency department overnight with complaints of swelling involving her lips and tongue. She reported taking Aleve last night shortly after which symptoms developed. She states having intermittent tong and lips swelling in the past however has not been as severe. She states usually taking Benadryl after which symptoms improved.  Objective: Filed Vitals:   07/07/15 0630 07/07/15 0700  BP: 115/76 122/75  Pulse: 77 69  Temp:    Resp: 18 22   No intake or output data in the 24 hours ending 07/07/15 0831 There were no vitals filed for this visit.  Exam:   General: She has awake and alert, in no distress, breathing comfortably on room air.  HEENT: There is swelling involving lips and tongue without evidence of respiratory compromise  Cardiovascular: Regular rate rhythm normal S1-S2  Respiratory: normal respiratory effort lungs are clear  Abdomen: obese, soft nontender nondistended  Musculoskeletal: no edema  Data Reviewed: Basic Metabolic Panel: No results for input(s): NA, K, CL, CO2, GLUCOSE, BUN, CREATININE,  CALCIUM, MG, PHOS in the last 168 hours. Liver Function Tests: No results for input(s): AST, ALT, ALKPHOS, BILITOT, PROT, ALBUMIN in the last 168 hours. No results for input(s): LIPASE, AMYLASE in the last 168 hours. No results for input(s): AMMONIA in the last 168 hours. CBC: No results for input(s): WBC, NEUTROABS, HGB, HCT, MCV, PLT in the last 168 hours. Cardiac Enzymes: No results for input(s): CKTOTAL, CKMB, CKMBINDEX, TROPONINI in the last 168 hours. BNP (last 3 results) No results for input(s): BNP in the last 8760 hours.  ProBNP (last 3 results) No results for input(s): PROBNP in the last 8760 hours.  CBG: No results for input(s): GLUCAP in the last 168 hours.  No results found for this or any previous visit (from the past 240 hour(s)).   Studies: No results found.  Scheduled Meds: . Racepinephrine HCl  0.5 mL Nebulization Once   Continuous Infusions:   Principal Problem:   Angioedema Active Problems:   Marijuana use   Severe obesity (BMI >= 40) (HCC)   GERD without esophagitis   Urticaria    Time spent:    Jeralyn Bennett  [facility] Pager [phone]. If 7PM-7AM, please contact night-coverage at [email or web address], password [facility] 07/07/2015, 8:31 AM  LOS: 0 days

## 2015-07-07 NOTE — H&P (Addendum)
[facility] History and Physical  Suzanne Short NWG:[id number] DOB: 31-Oct-1995 DOA: 07/06/2015  Referring physician: Dr. Rolland Porter. PCP: Colette Ribas, MD   Chief Complaint: lips and tonge swelling, due to angiodeema.   HPI: Suzanne Short is a 20 y.o. female with a past medical history significant for obesity, gastroesophageal reflux disease and hypertension (appreciated only   during pregnancy); who presented to the major department after experiencing acute swelling of her lips and tongue. Patient reports some associated hives/urticaria lesion affecting her shoulders arms and lower extremities. No nausea, no vomiting, no hematuria, no dysuria, no chest pain, diaphoresis, fever, chills or any other acute complaints. patient is currently resting comfortable after receiving epinephrine, Benadryl, and Pepcid in the ED. Given degree of swelling S in her lips and tongue ankles are for potential progression, [facility] has been called for an observation admission for angioedema.  Of note, patient with a prior history of allergic reaction to Aleve; but despite that, she has taken one dose day prior to admission due to shoulder pain.  Past Medical History  Diagnosis Date  . Swelling   . Bladder infection, chronic   . Supervision of normal pregnancy in first trimester 05/03/2013  . GERD (gastroesophageal reflux disease)   . Hypertension   . Renal disorder     chronic bladder infections   Past Surgical History  Procedure Laterality Date  . Tonsillectomy     Social History:  reports that she quit smoking about 2 years ago. Her smoking use included Cigarettes. She has a 20 pack-year smoking history. She has quit using smokeless tobacco. Her smokeless tobacco use included Chew. She reports that she does not drink alcohol or use illicit drugs.  Allergies  Allergen Reactions  . Aleve [Naproxen Sodium] Hives  . Amoxicillin Hives    Able to take Keflex.  . Tylenol [Acetaminophen]  Hives    Family History  Problem Relation Age of Onset  . Diabetes Paternal Grandfather   . Heart disease Paternal Grandfather   . Diabetes Paternal Grandmother   . Heart disease Paternal Grandmother   . Diabetes Maternal Grandmother   . Heart disease Maternal Grandmother   . Diabetes Maternal Grandfather   . Heart disease Maternal Grandfather   . Heart disease Mother     Prior to Admission medications   Medication Sig Start Date End Date Taking? Authorizing Provider  diphenhydrAMINE (BENADRYL) 25 mg capsule Take 50 mg by mouth every 6 (six) hours as needed. Allergies   Yes Historical Provider, MD  fexofenadine (ALLEGRA) 180 MG tablet Take 180 mg by mouth daily as needed for allergies or rhinitis.   Yes Historical Provider, MD  medroxyPROGESTERone (DEPO-PROVERA) 150 MG/ML injection Inject 1 mL (150 mg total) into the muscle every 3 (three) months. 04/07/15  Yes Jacklyn Shell, CNM  naproxen sodium (ALEVE) 220 MG tablet Take 440 mg by mouth daily as needed (for pain).   Yes Historical Provider, MD  EPINEPHrine (EPI-PEN) 0.3 mg/0.3 mL DEVI Inject 0.3 mLs (0.3 mg total) into the muscle once. 01/27/12   Ivery Quale, PA-C   Physical Exam: Filed Vitals:   07/06/15 2330 07/07/15 0030 07/07/15 0100 07/07/15 0130  BP: 120/58 115/59 109/53 113/63  Pulse: 74 78 67 72  Temp:      TempSrc:      Resp: SpO2: 98% 100% 100% 94%    Wt Readings from Last 3 Encounters:  03/24/15 131.997 kg (291 lb)  10/15/14 140.842 kg (310 lb  8 oz)  09/17/14 137.168 kg (302 lb 6.4 oz)    General:  Obvious, Appears calm and comfortable; with good oxygen saturation on room air and in no distress. Patient endorses that her urticaria lesions are blanching/resolving rapidly. Eyes: PERRL, normal lids, irises & conjunctiva, no icterus, no nystagmus ENT: grossly normal hearing, enlarged/swollen tongue and lips; no erythema, no exudates, good dentition. There is no drainage out of her ears or  nostrils Neck: Supple, no LAD, masses or thyromegaly Cardiovascular: RRR, no m/r/g. No LE edema. Respiratory: Good air movement bilaterally, no use of accessory muscles; there is no wheezing, no crackles, no rhonchi  Abdomen: Obese, soft, non-tender, non-distended; positive bowel sounds; no guarding Skin: Hives appreciated along her shoulders, forearms and lower extremities Musculoskeletal: grossly normal tone BUE/BLE Psychiatric: grossly normal mood and affect, speech fluent and appropriate Neurologic: grossly non-focal.          CBG: No results for input(s): GLUCAP in the last 168 hours.  Radiological Exams on Admission: No results found.  EKG: None   Assessment/Plan 1-Angioedema: With severe leaps (upper and lower) and tongue swelling. There is no appreciation of involvement of her pharynx and currently denying any difficulty breathing. -Patient will be admitted in observation -Will continue treatment with antihistamines and will also add steroids -Receive epinephrine in the emergency department -Patient will benefit of outpatient referral to see an allergist -Continue supportive care and follow clinical response.  2-Marijuana use: Patient reports that the use is occasional and do not see it as a big problem. -Cessation counseling provided  3-Severe obesity (BMI >= 40) (HCC) -Low-calorie diet and exercise discussed with patient  4-GERD without esophagitis:  -Will continue treatment with PPI    5-Urticaria: Improving with the use of steroids and antihistamines. -Continue current medications -Follow clinical response.  Code Status: Full code DVT Prophylaxis: Lovenox Family Communication: No family at bedside Disposition Plan: Observation, length of stay less than 2 midnights; MedSurg bed  Time spent: 45 minutes  Vassie Loll [facility] Pager [phone]

## 2015-07-08 LAB — C4 COMPLEMENT: Complement C4, Body Fluid: 19 mg/dL (ref 14–44)

## 2015-07-08 MED ORDER — EPINEPHRINE 0.3 MG/0.3ML IJ DEVI: 0.3000 mg | Freq: Once | INTRAMUSCULAR | Status: DC

## 2015-07-08 MED ORDER — BLOOD GLUCOSE MONITOR KIT: PACK | Status: DC

## 2015-07-08 MED ORDER — FAMOTIDINE 20 MG PO TABS: 20.0000 mg | ORAL_TABLET | Freq: Two times a day (BID) | ORAL | Status: DC

## 2015-07-08 MED ORDER — PREDNISONE 10 MG PO TABS: ORAL_TABLET | ORAL | Status: DC

## 2015-07-08 MED ORDER — DIPHENHYDRAMINE HCL 25 MG PO CAPS: 50.0000 mg | ORAL_CAPSULE | Freq: Four times a day (QID) | ORAL | Status: DC | PRN

## 2015-07-08 NOTE — Discharge Summary (Signed)
Physician Discharge Summary  Suzanne Short LHT:[id number] DOB: 06-19-1995 DOA: 07/06/2015  PCP: Purvis Kilts, MD  Admit date: 07/06/2015 Discharge date: 07/08/2015  Time spent: 35 minutes  Recommendations for Outpatient Follow-up:  1. Follow up with PCP in 1-2 weeks. 2. Follow up with allergist within 2 weeks for outpatient evaluation for definitive testing   Discharge Diagnoses:  Principal Problem:   Angioedema Active Problems:   Marijuana use   Severe obesity (BMI >= 40) (HCC)   GERD without esophagitis   Urticaria   Discharge Condition: Improved  Diet recommendation: Regular  There were no vitals filed for this visit.  History of present illness:  69 yof with a hx of GERD and obesity presented with swelling of her lips and tongue that began after taking Aleve. She also reported hives over her shoulder, arms, and bilateral lower extremities. She was given Epinephrine, Benadryl, and Pepcid in the ED with some improvement and was admitted for further observation.   Hospital Course:  Patient's angioedema and urticaria, likely related to a reaction to Aleve, was treated with steroids and antihistamines with significant improvement. She did not have any airway compromise. Patient will need to follow up with an allergist as an outpatient. She was educated to avoid NSAIDs until definitive cause of reaction could be found. She will need to follow up with an allergist as an outpatient for definitive testing and will be prescribed benadryl, PRN pepcid, and an epipen.  1. Marijuana use, counseled on the importance of cessation. 2. GERD, continue PPI.  3. Severe obesity.  Procedures:  none  Consultations:   none  Discharge Exam: Filed Vitals:   07/07/15 1939 07/08/15 0500  BP: 133/55 118/52  Pulse: 78 77  Temp: 97.8 F (36.6 C) 97.8 F (36.6 C)  Resp: 18 16    4. General: NAD, looks comfortable 5. Cardiovascular: RRR, S1, S2  6. Respiratory: clear bilaterally, No  wheezing, rales or rhonchi 7. Abdomen: soft, non tender, no distention , bowel sounds normal 8. Musculoskeletal: No edema b/l  Discharge Instructions   Discharge Instructions    Diet - low sodium heart healthy    Complete by:  As directed      Increase activity slowly    Complete by:  As directed           Current Discharge Medication List    START taking these medications   Details  blood glucose meter kit and supplies KIT Dispense based on patient and insurance preference. Use up to four times daily as directed. (FOR ICD-9 250.00, 250.01). Qty: 1 each, Refills: 0    famotidine (PEPCID) 20 MG tablet Take 1 tablet (20 mg total) by mouth 2 (two) times daily. Qty: 60 tablet, Refills: 0    predniSONE (DELTASONE) 10 MG tablet Take 66m po daily for 1 day then 36mdaily for 1 day then 2034maily for 1 day sthen 77m14mily for 1 day then stop Qty: 10 tablet, Refills: 0      CONTINUE these medications which have CHANGED   Details  diphenhydrAMINE (BENADRYL) 25 mg capsule Take 2 capsules (50 mg total) by mouth every 6 (six) hours as needed. Allergies Qty: 30 capsule, Refills: 0    EPINEPHrine (EPI-PEN) 0.3 mg/0.3 mL DEVI Inject 0.3 mLs (0.3 mg total) into the muscle once. Qty: 1 Device, Refills: 1      CONTINUE these medications which have NOT CHANGED   Details  fexofenadine (ALLEGRA) 180 MG tablet Take 180 mg by mouth  daily as needed for allergies or rhinitis.    medroxyPROGESTERone (DEPO-PROVERA) 150 MG/ML injection Inject 1 mL (150 mg total) into the muscle every 3 (three) months. Qty: 1 mL, Refills: 3      STOP taking these medications     naproxen sodium (ALEVE) 220 MG tablet        Allergies  Allergen Reactions  . Aleve [Naproxen Sodium] Hives  . Amoxicillin Hives    Able to take Keflex.  . Tylenol [Acetaminophen] Hives      The results of significant diagnostics from this hospitalization (including imaging, microbiology, ancillary and laboratory) are  listed below for reference.       Labs: Basic Metabolic Panel:  Recent Labs Lab 07/07/15 1036  NA 139  K 4.0  CL 109  CO2 21*  GLUCOSE 210*  BUN 13  CREATININE 0.80  CALCIUM 8.9   Liver Function Tests:  Recent Labs Lab 07/07/15 1036  AST 22  ALT 14  ALKPHOS 80  BILITOT 0.7  PROT 6.5  ALBUMIN 3.5    CBC:  Recent Labs Lab 07/07/15 1036  WBC 12.4*  NEUTROABS 11.3*  HGB 13.9  HCT 41.4  MCV 83.6  PLT 259      Signed: Jimmey Hengel. MD  [facility] 07/08/2015, 12:38 PM   By signing my name below, I, Delene Ruffini, attest that this documentation has been prepared under the direction and in the presence of Kathie Dike, MD. Electronically Signed: Delene Ruffini  07/08/2015  I, Dr. Kathie Dike, personally performed the services described in this documentaiton. All medical record entries made by the scribe were at my direction and in my presence. I have reviewed the chart and agree that the record reflects my personal performance and is accurate and complete  Kathie Dike, MD, 07/08/2015 12:38 PM

## 2015-07-08 NOTE — Care Management Note (Signed)
Case Management Note  Patient Details  Name: Suzanne Short MRN: [id number] Date of Birth: 19-Feb-1996  Subjective/Objective:                  Pt admitted with angioedema. Pt is from home and ind with ADL's. Pt has family planning medicaid and has spoken with the [facility] already to discuss options for assistance. Pt plans to return home with self care at DC. Pt has PCP (Dr. Phillips Odor) to f/u with. Pt not anticipated to be discharged home on any medications not available OTC.    Action/Plan: No CM needs at this time.   Expected Discharge Date:    07/08/2015              Expected Discharge Plan:  Home/Self Care  In-House Referral:  NA  Discharge planning Services  CM Consult  Post Acute Care Choice:  NA Choice offered to:  NA  DME Arranged:    DME Agency:     HH Arranged:    HH Agency:     Status of Service:  Completed, signed off  Medicare Important Message Given:    Date Medicare IM Given:    Medicare IM give by:    Date Additional Medicare IM Given:    Additional Medicare Important Message give by:     If discussed at Long Length of Stay Meetings, dates discussed:    Additional Comments:  Marvia, Troost, RN 07/08/2015, 11:33 AM

## 2015-07-14 ENCOUNTER — Encounter: Payer: Self-pay | Admitting: Adult Health

## 2015-07-14 ENCOUNTER — Ambulatory Visit (INDEPENDENT_AMBULATORY_CARE_PROVIDER_SITE_OTHER): Payer: Medicaid Other | Admitting: Adult Health

## 2015-07-14 ENCOUNTER — Other Ambulatory Visit (HOSPITAL_COMMUNITY)
Admission: RE | Admit: 2015-07-14 | Discharge: 2015-07-14 | Disposition: A | Discharge status: Home / Self Care | Payer: Medicaid Other | Source: Ambulatory Visit | Attending: Adult Health | Admitting: Adult Health

## 2015-07-14 VITALS — BP 110/60 | HR 92 | Ht 67.0 in | Wt 283.5 lb

## 2015-07-14 DIAGNOSIS — Z309 Encounter for contraceptive management, unspecified: Secondary | ICD-10-CM | POA: Insufficient documentation

## 2015-07-14 DIAGNOSIS — Z113 Encounter for screening for infections with a predominantly sexual mode of transmission: Secondary | ICD-10-CM | POA: Diagnosis present

## 2015-07-14 DIAGNOSIS — Z3042 Encounter for surveillance of injectable contraceptive: Secondary | ICD-10-CM

## 2015-07-14 DIAGNOSIS — Z1151 Encounter for screening for human papillomavirus (HPV): Secondary | ICD-10-CM | POA: Diagnosis not present

## 2015-07-14 DIAGNOSIS — Z3009 Encounter for other general counseling and advice on contraception: Secondary | ICD-10-CM

## 2015-07-14 DIAGNOSIS — Z01411 Encounter for gynecological examination (general) (routine) with abnormal findings: Secondary | ICD-10-CM | POA: Insufficient documentation

## 2015-07-14 DIAGNOSIS — Z01419 Encounter for gynecological examination (general) (routine) without abnormal findings: Secondary | ICD-10-CM

## 2015-07-14 HISTORY — DX: Encounter for contraceptive management, unspecified: Z30.9

## 2015-07-14 MED ORDER — MEDROXYPROGESTERONE ACETATE 150 MG/ML IM SUSP: 150.0000 mg | INTRAMUSCULAR | Status: DC

## 2015-07-14 NOTE — Patient Instructions (Signed)
Return in 1 day for stat [facility] at 8:30 and then pm for depo physical in 1 year Follow up Dr Phillips Odor about sugars

## 2015-07-14 NOTE — Progress Notes (Signed)
Patient ID: Suzanne Short, female   DOB: [birth year], 20 y.o.   MRN: [id number] History of Present Illness: Suzanne Short is a 20 year old white female in for well woman gyn exam and pap.She is past due for her depo, as she had allergic reaction to ?NSAID and was in the hospital for 2 days.She says she has not had sex in like 3 years.Her sugar was elevated in hospital and she is checking sugars at home. PCP is Dr Phillips Odor.  Current Medications, Allergies, Past Medical History, Past Surgical History, Family History and Social History were reviewed in [employer] record.     Review of Systems: Patient denies any headaches, hearing loss, fatigue, blurred vision, shortness of breath, chest pain, abdominal pain, problems with bowel movements, urination, or intercourse(not having sex). No joint pain or mood swings.    Physical Exam:BP 110/60 mmHg  Pulse 92  Ht  (1.702 m)  Wt 283 lb 8 oz (128.595 kg)  BMI 44.39 kg/m2  Breastfeeding? No General:  Well developed, well nourished, no acute distress Skin:  Warm and dry, has several tattoos Neck:  Midline trachea, normal thyroid, good ROM, no lymphadenopathy Lungs; Clear to auscultation bilaterally Breast:  No dominant palpable mass, retraction, or nipple discharge Cardiovascular: Regular rate and rhythm Abdomen:  Soft, non tender, no hepatosplenomegaly.obese Pelvic:  External genitalia is normal in appearance, no lesions.Has hidradenitis scars inner thighs.  The vagina is normal in appearance. Urethra has no lesions or masses. The cervix is bulbous. Pap with GC/CHL performed. Uterus is felt to be normal size, shape, and contour.  No adnexal masses or tenderness noted.Bladder is non tender, no masses felt. Extremities/musculoskeletal:  No swelling or varicosities noted, no clubbing or cyanosis Psych:  No mood changes, alert and cooperative,seems happy   Impression: Well woman gyn exam and pap, has family planning  medicaid Contraceptive management     Plan: Return in am for stat QHCG at 8:30 and get HIV,RPR and HSV 2 then  And get depo in pm Refilled depo provera 150 mg IM every 3 months in office with 3 refills Physical in 1 year Follow up with Dr Phillips Odor for blood sugars

## 2015-07-15 ENCOUNTER — Ambulatory Visit (INDEPENDENT_AMBULATORY_CARE_PROVIDER_SITE_OTHER): Payer: Medicaid Other | Admitting: *Deleted

## 2015-07-15 ENCOUNTER — Other Ambulatory Visit: Payer: Medicaid Other

## 2015-07-15 ENCOUNTER — Encounter: Payer: Self-pay | Admitting: *Deleted

## 2015-07-15 ENCOUNTER — Other Ambulatory Visit: Payer: Self-pay | Admitting: Adult Health

## 2015-07-15 DIAGNOSIS — Z3042 Encounter for surveillance of injectable contraceptive: Secondary | ICD-10-CM

## 2015-07-15 DIAGNOSIS — Z113 Encounter for screening for infections with a predominantly sexual mode of transmission: Secondary | ICD-10-CM

## 2015-07-15 LAB — BETA HCG QUANT (REF LAB)

## 2015-07-15 MED ORDER — MEDROXYPROGESTERONE ACETATE 150 MG/ML IM SUSP
150.0000 mg | Freq: Once | INTRAMUSCULAR | Status: AC
  Administered 2015-07-15: 150 mg via INTRAMUSCULAR

## 2015-07-15 NOTE — Progress Notes (Signed)
Pt here for Depo. Pt had a neg quant this am. Pt tolerated shot well. Return in 12 weeks for next shot. JSY 

## 2015-07-16 LAB — HSV 2 ANTIBODY, IGG: HSV 2 GLYCOPROTEIN G AB, IGG: 6.23 {index} — AB (ref 0.00–0.90)

## 2015-07-16 LAB — HIV ANTIBODY (ROUTINE TESTING W REFLEX): HIV SCREEN 4TH GENERATION: NONREACTIVE

## 2015-07-16 LAB — RPR: RPR Ser Ql: NONREACTIVE

## 2015-07-16 LAB — CYTOLOGY - PAP

## 2015-07-20 ENCOUNTER — Telehealth: Payer: Self-pay | Admitting: Adult Health

## 2015-07-20 ENCOUNTER — Encounter: Payer: Self-pay | Admitting: Adult Health

## 2015-07-20 DIAGNOSIS — R768 Other specified abnormal immunological findings in serum: Secondary | ICD-10-CM | POA: Insufficient documentation

## 2015-07-20 HISTORY — DX: Other specified abnormal immunological findings in serum: R76.8

## 2015-07-20 NOTE — Telephone Encounter (Signed)
Left message to call about labs, if she calls HSV2 +, HIV negative and RPR negative.

## 2015-07-21 ENCOUNTER — Telehealth: Payer: Self-pay | Admitting: Adult Health

## 2015-07-21 NOTE — Telephone Encounter (Signed)
Pt aware of ASCUS pap and +HPV, will schedule colpo with Dr Despina HiddenEure

## 2015-07-27 ENCOUNTER — Encounter: Payer: Medicaid Other | Admitting: Obstetrics and Gynecology

## 2015-08-03 ENCOUNTER — Encounter: Payer: Medicaid Other | Admitting: Obstetrics & Gynecology

## 2015-10-07 ENCOUNTER — Encounter: Payer: Self-pay | Admitting: *Deleted

## 2015-10-07 ENCOUNTER — Ambulatory Visit (INDEPENDENT_AMBULATORY_CARE_PROVIDER_SITE_OTHER): Payer: Medicaid Other | Admitting: *Deleted

## 2015-10-07 DIAGNOSIS — Z3042 Encounter for surveillance of injectable contraceptive: Secondary | ICD-10-CM

## 2015-10-07 DIAGNOSIS — Z3202 Encounter for pregnancy test, result negative: Secondary | ICD-10-CM

## 2015-10-07 LAB — POCT URINE PREGNANCY: Preg Test, Ur: NEGATIVE

## 2015-10-07 MED ORDER — MEDROXYPROGESTERONE ACETATE 150 MG/ML IM SUSP
150.0000 mg | Freq: Once | INTRAMUSCULAR | Status: AC
  Administered 2015-10-07: 150 mg via INTRAMUSCULAR

## 2015-10-07 NOTE — Progress Notes (Signed)
Pt here for Depo. Pt tolerated shot well. Return in 12 weeks for next shot. JSY 

## 2015-12-30 ENCOUNTER — Other Ambulatory Visit: Payer: Self-pay | Admitting: *Deleted

## 2015-12-30 ENCOUNTER — Encounter: Payer: Self-pay | Admitting: *Deleted

## 2015-12-30 ENCOUNTER — Ambulatory Visit (INDEPENDENT_AMBULATORY_CARE_PROVIDER_SITE_OTHER): Payer: Medicaid Other | Admitting: *Deleted

## 2015-12-30 DIAGNOSIS — Z3049 Encounter for surveillance of other contraceptives: Secondary | ICD-10-CM

## 2015-12-30 DIAGNOSIS — Z3042 Encounter for surveillance of injectable contraceptive: Secondary | ICD-10-CM

## 2015-12-30 DIAGNOSIS — Z3202 Encounter for pregnancy test, result negative: Secondary | ICD-10-CM

## 2015-12-30 LAB — POCT URINE PREGNANCY: Preg Test, Ur: NEGATIVE

## 2015-12-30 MED ORDER — MEDROXYPROGESTERONE ACETATE 150 MG/ML IM SUSP
150.0000 mg | Freq: Once | INTRAMUSCULAR | Status: AC
  Administered 2015-12-30: 150 mg via INTRAMUSCULAR

## 2015-12-30 MED ORDER — MEDROXYPROGESTERONE ACETATE 150 MG/ML IM SUSP: 150.0000 mg | INTRAMUSCULAR | 3 refills | Status: DC

## 2015-12-30 NOTE — Progress Notes (Signed)
Pt here for Depo. Pt tolerated shot well. Return in 12 weeks for next shot. JSY 

## 2016-03-23 ENCOUNTER — Emergency Department (HOSPITAL_COMMUNITY)
Admission: EM | Admit: 2016-03-23 | Discharge: 2016-03-23 | Disposition: A | Discharge status: Home / Self Care | Payer: Medicaid Other | Attending: Emergency Medicine | Admitting: Emergency Medicine

## 2016-03-23 ENCOUNTER — Encounter (HOSPITAL_COMMUNITY): Payer: Self-pay | Admitting: *Deleted

## 2016-03-23 DIAGNOSIS — Z79899 Other long term (current) drug therapy: Secondary | ICD-10-CM | POA: Insufficient documentation

## 2016-03-23 DIAGNOSIS — Z87891 Personal history of nicotine dependence: Secondary | ICD-10-CM | POA: Insufficient documentation

## 2016-03-23 DIAGNOSIS — I10 Essential (primary) hypertension: Secondary | ICD-10-CM | POA: Insufficient documentation

## 2016-03-23 DIAGNOSIS — T7840XA Allergy, unspecified, initial encounter: Secondary | ICD-10-CM | POA: Insufficient documentation

## 2016-03-23 MED ORDER — DIPHENHYDRAMINE HCL 50 MG/ML IJ SOLN
25.0000 mg | Freq: Once | INTRAMUSCULAR | Status: AC
  Administered 2016-03-23: 25 mg via INTRAVENOUS
  Filled 2016-03-23: qty 1

## 2016-03-23 MED ORDER — EPINEPHRINE 0.3 MG/0.3ML IJ SOAJ
0.3000 mg | Freq: Once | INTRAMUSCULAR | Status: AC
  Administered 2016-03-23: 0.3 mg via INTRAMUSCULAR
  Filled 2016-03-23: qty 0.3

## 2016-03-23 MED ORDER — PREDNISONE 10 MG PO TABS: 20.0000 mg | ORAL_TABLET | Freq: Every day | ORAL | 0 refills | Status: DC

## 2016-03-23 MED ORDER — METHYLPREDNISOLONE SODIUM SUCC 125 MG IJ SOLR
125.0000 mg | Freq: Once | INTRAMUSCULAR | Status: AC
  Administered 2016-03-23: 125 mg via INTRAVENOUS
  Filled 2016-03-23: qty 2

## 2016-03-23 NOTE — ED Provider Notes (Signed)
[facility] DEPT Provider Note   CSN: [id number] Arrival date & time: 03/23/16  0802  By signing my name below, I, Delton Prairie, attest that this documentation has been prepared under the direction and in the presence of Milton Ferguson, MD  Electronically Signed: Delton Prairie, ED Scribe. 03/23/16. 9:22 AM.  History   Chief Complaint Chief Complaint  Patient presents with  . Oral Swelling   Patient complains of swelling to her lips and tongue   The history is provided by the patient. No language interpreter was used.  Allergic Reaction  Presenting symptoms: swelling   Presenting symptoms: no rash   Severity:  Mild Prior allergic episodes:  Unable to specify Context: not animal exposure   Relieved by:  Antihistamines Worsened by:  Nothing  HPI Comments:  Suzanne Short is a 20 y.o. female who presents to the Emergency Department complaining of gradually improving, mild lip and tongue swelling which began at 4 PM x 1 day. Pt does not know the cause of her reaction. She denies any associated symptoms. Pt has taken benadryl and pepcid with little relief, her last does was at 6 Am today. She notes she has had similar symptoms multiple times in the past. Pt denies any other symptoms or complaints at this time.   Past Medical History:  Diagnosis Date  . Bladder infection, chronic   . Contraceptive management 07/14/2015  . GERD (gastroesophageal reflux disease)   . Hidradenitis   . 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 Hypertension   . Renal disorder    chronic bladder infections  . Supervision of normal pregnancy in first trimester 05/03/2013  . Swelling     Patient Active Problem List   Diagnosis Date Noted  . 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 Contraceptive management 07/14/2015  . Angioedema 07/07/2015  . GERD without esophagitis 07/07/2015  . Urticaria 07/07/2015  . Hypoactive sexual desire 08/06/2014  . Mood changes ([place]) 08/06/2014  . Hidradenitis suppurativa 04/23/2014  .  Intertrigo labialis 10/30/2013  . Severe obesity (BMI >= 40) ([place]) 06/26/2013  . Marijuana use 06/24/2013    Past Surgical History:  Procedure Laterality Date  . TONSILLECTOMY      OB History    Gravida Para Term Preterm AB Living   1 1 1  0 0 1   SAB TAB Ectopic Multiple Live Births   0 0 0 0 1       Home Medications    Prior to Admission medications   Medication Sig Start Date End Date Taking? Authorizing Provider  blood glucose meter kit and supplies KIT Dispense based on patient and insurance preference. Use up to four times daily as directed. (FOR ICD-9 250.00, 250.01). 07/08/15   Kathie Dike, MD  diphenhydrAMINE (BENADRYL) 25 mg capsule Take 2 capsules (50 mg total) by mouth every 6 (six) hours as needed. Allergies 07/08/15   Kathie Dike, MD  EPINEPHrine (EPI-PEN) 0.3 mg/0.3 mL DEVI Inject 0.3 mLs (0.3 mg total) into the muscle once. 07/08/15   Kathie Dike, MD  famotidine (PEPCID) 20 MG tablet Take 1 tablet (20 mg total) by mouth 2 (two) times daily. 07/08/15   Kathie Dike, MD  fexofenadine (ALLEGRA) 180 MG tablet Take 180 mg by mouth daily as needed for allergies or rhinitis.    Historical Provider, MD  medroxyPROGESTERone (DEPO-PROVERA) 150 MG/ML injection Inject 1 mL (150 mg total) into the muscle every 3 (three) months. 12/30/15   Estill Dooms, NP    Family History Family History  Problem Relation  Age of Onset  . Diabetes Paternal Grandfather   . Heart disease Paternal Grandfather   . Diabetes Paternal Grandmother   . Heart disease Paternal Grandmother   . Diabetes Maternal Grandmother   . Heart disease Maternal Grandmother   . Diabetes Maternal Grandfather   . Heart disease Maternal Grandfather   . Heart disease Mother     Social History Social History  Substance Use Topics  . Smoking status: Former Smoker    Packs/day: 10.00    Years: 2.00    Types: Cigarettes    Quit date: 04/04/2013  . Smokeless tobacco: Former Systems developer    Types: Chew  .  Alcohol use 0.0 oz/week     Comment: occ     Allergies   Nsaids; Aleve [naproxen sodium]; Amoxicillin; and Tylenol [acetaminophen]   Review of Systems Review of Systems  Constitutional: Negative for appetite change and fatigue.  HENT: Positive for facial swelling (lip and tongue swelling). Negative for congestion, ear discharge and sinus pressure.   Eyes: Negative for discharge.  Respiratory: Negative for cough.   Cardiovascular: Negative for chest pain.  Gastrointestinal: Negative for abdominal pain and diarrhea.  Genitourinary: Negative for frequency and hematuria.  Musculoskeletal: Negative for back pain.  Skin: Negative for rash.  Neurological: Negative for seizures and headaches.  Psychiatric/Behavioral: Negative for hallucinations.     Physical Exam Updated Vital Signs There were no vitals taken for this visit.  Physical Exam  Constitutional: She is oriented to person, place, and time. She appears well-developed.  HENT:  Head: Normocephalic.  Eyes: Conjunctivae and EOM are normal. No scleral icterus.  Neck: Neck supple. No thyromegaly present.  Cardiovascular: Normal rate and regular rhythm.  Exam reveals no gallop and no friction rub.   No murmur heard. Pulmonary/Chest: No stridor. She has no wheezes. She has no rales. She exhibits no tenderness.  Abdominal: She exhibits no distension. There is no tenderness. There is no rebound.  Musculoskeletal: Normal range of motion. She exhibits no edema.  Lymphadenopathy:    She has no cervical adenopathy.  Neurological: She is oriented to person, place, and time. She exhibits normal muscle tone. Coordination normal.  Skin: No rash noted. No erythema.  Minimal swelling to lower lip and tongue.   Psychiatric: She has a normal mood and affect. Her behavior is normal.  Nursing note and vitals reviewed.    ED Treatments / Results  DIAGNOSTIC STUDIES:  Oxygen Saturation is 99% on RA, normal by my interpretation.     COORDINATION OF CARE:  9:16 AM Discussed treatment plan with pt at bedside and pt agreed to plan.  Labs (all labs ordered are listed, but only abnormal results are displayed) Labs Reviewed - No data to display  EKG  EKG Interpretation None       Radiology No results found.  Procedures Procedures (including critical care time)  Medications Ordered in ED Medications - No data to display   Initial Impression / Assessment and Plan / ED Course  I have reviewed the triage vital signs and the nursing notes.  Pertinent labs & imaging results that were available during my care of the patient were reviewed by me and considered in my medical decision making (see chart for details).  Clinical Course     Patient improved with treatment. Diagnosis allergic reaction with oral swelling. Patient will be discharged home with prednisone and will take Benadryl and Pepcid and follow-up with her PCP  Final Clinical Impressions(s) / ED Diagnoses   Final  diagnoses:  None    New Prescriptions New Prescriptions   No medications on file  The chart was scribed for me under my direct supervision.  I personally performed the history, physical, and medical decision making and all procedures in the evaluation of this patient.Milton Ferguson, MD 03/23/16 [phone]

## 2016-03-23 NOTE — Discharge Instructions (Signed)
Follow-up with your family doctor in 2 days if not improving. Take Benadryl every 6 hours for swelling. Take Pepcid twice a day for 5 days

## 2016-03-23 NOTE — ED Triage Notes (Signed)
Pt comes in for lip swelling starting yesterday at 1600. Pt states she took benadryl and Pepcid which helped her swelling. She woke up today with continued swelling and tongue swelling. No throat swelling noted upon triage.

## 2016-03-23 NOTE — ED Notes (Signed)
ED Provider at bedside. 

## 2016-03-31 ENCOUNTER — Ambulatory Visit (INDEPENDENT_AMBULATORY_CARE_PROVIDER_SITE_OTHER): Payer: Medicaid Other | Admitting: *Deleted

## 2016-03-31 ENCOUNTER — Encounter: Payer: Self-pay | Admitting: *Deleted

## 2016-03-31 DIAGNOSIS — Z308 Encounter for other contraceptive management: Secondary | ICD-10-CM | POA: Diagnosis not present

## 2016-03-31 DIAGNOSIS — Z3202 Encounter for pregnancy test, result negative: Secondary | ICD-10-CM

## 2016-03-31 LAB — POCT URINE PREGNANCY: PREG TEST UR: NEGATIVE

## 2016-03-31 MED ORDER — MEDROXYPROGESTERONE ACETATE 150 MG/ML IM SUSP
150.0000 mg | Freq: Once | INTRAMUSCULAR | Status: AC
  Administered 2016-03-31: 150 mg via INTRAMUSCULAR

## 2016-03-31 NOTE — Progress Notes (Signed)
Pt here for Depo. Pt was 1 day late getting shot. JAG advised ok to give shot today.  Pt tolerated shot well. Return in 12 weeks for next shot. JSY

## 2016-07-04 ENCOUNTER — Ambulatory Visit (INDEPENDENT_AMBULATORY_CARE_PROVIDER_SITE_OTHER): Payer: Medicaid Other | Admitting: *Deleted

## 2016-07-04 ENCOUNTER — Encounter: Payer: Self-pay | Admitting: *Deleted

## 2016-07-04 DIAGNOSIS — Z3202 Encounter for pregnancy test, result negative: Secondary | ICD-10-CM

## 2016-07-04 DIAGNOSIS — Z308 Encounter for other contraceptive management: Secondary | ICD-10-CM | POA: Diagnosis not present

## 2016-07-04 LAB — POCT URINE PREGNANCY: PREG TEST UR: NEGATIVE

## 2016-07-04 MED ORDER — MEDROXYPROGESTERONE ACETATE 150 MG/ML IM SUSP
150.0000 mg | Freq: Once | INTRAMUSCULAR | Status: AC
  Administered 2016-07-04: 150 mg via INTRAMUSCULAR

## 2016-07-04 NOTE — Progress Notes (Signed)
Pt here for Depo. Pt is 4 days late getting shot. Last sex was in October. Selena BattenKim, CNM advised ok to give shot today with neg pregnancy test. Pt tolerated shot well. Return in 12 weeks for next shot. JSY

## 2016-07-07 ENCOUNTER — Encounter (HOSPITAL_COMMUNITY): Payer: Self-pay

## 2016-07-07 ENCOUNTER — Emergency Department (HOSPITAL_COMMUNITY)
Admission: EM | Admit: 2016-07-07 | Discharge: 2016-07-07 | Disposition: A | Discharge status: Home / Self Care | Payer: Medicaid Other | Attending: Emergency Medicine | Admitting: Emergency Medicine

## 2016-07-07 DIAGNOSIS — Z79899 Other long term (current) drug therapy: Secondary | ICD-10-CM | POA: Insufficient documentation

## 2016-07-07 DIAGNOSIS — Z87891 Personal history of nicotine dependence: Secondary | ICD-10-CM | POA: Insufficient documentation

## 2016-07-07 DIAGNOSIS — I10 Essential (primary) hypertension: Secondary | ICD-10-CM | POA: Insufficient documentation

## 2016-07-07 DIAGNOSIS — T783XXA Angioneurotic edema, initial encounter: Secondary | ICD-10-CM | POA: Insufficient documentation

## 2016-07-07 DIAGNOSIS — T7840XA Allergy, unspecified, initial encounter: Secondary | ICD-10-CM

## 2016-07-07 MED ORDER — EPINEPHRINE PF 1 MG/ML IJ SOLN
INTRAMUSCULAR | Status: AC
  Filled 2016-07-07: qty 1

## 2016-07-07 MED ORDER — EPINEPHRINE PF 1 MG/10ML IJ SOSY: 0.3000 mg | PREFILLED_SYRINGE | Freq: Once | INTRAMUSCULAR | Status: DC

## 2016-07-07 MED ORDER — DIPHENHYDRAMINE HCL 50 MG/ML IJ SOLN
25.0000 mg | Freq: Once | INTRAMUSCULAR | Status: AC
  Administered 2016-07-07: 25 mg via INTRAMUSCULAR
  Filled 2016-07-07: qty 1

## 2016-07-07 MED ORDER — DIPHENHYDRAMINE HCL 50 MG/ML IJ SOLN: 25.0000 mg | Freq: Once | INTRAMUSCULAR | Status: DC

## 2016-07-07 MED ORDER — EPINEPHRINE PF 1 MG/ML IJ SOLN
INTRAMUSCULAR | Status: AC
  Administered 2016-07-07: 0.3 mg
  Filled 2016-07-07: qty 1

## 2016-07-07 MED ORDER — FAMOTIDINE 20 MG PO TABS
20.0000 mg | ORAL_TABLET | Freq: Once | ORAL | Status: AC
  Administered 2016-07-07: 20 mg via ORAL
  Filled 2016-07-07: qty 1

## 2016-07-07 MED ORDER — METHYLPREDNISOLONE SODIUM SUCC 125 MG IJ SOLR
125.0000 mg | Freq: Once | INTRAMUSCULAR | Status: AC
  Administered 2016-07-07: 125 mg via INTRAMUSCULAR
  Filled 2016-07-07: qty 2

## 2016-07-07 MED ORDER — PREDNISONE 20 MG PO TABS: ORAL_TABLET | ORAL | 0 refills | Status: DC

## 2016-07-07 NOTE — ED Provider Notes (Signed)
[place] [facility] DEPT Provider Note   CSN: [id number] Arrival date & time: 07/07/16  0521  Time seen 05:50 AM   History   Chief Complaint Chief Complaint  Patient presents with  . Allergic Reaction    HPI Suzanne Short is a 21 y.o. female.  HPI  patient reports she takes Benadryl and Pepcid daily because of daily swelling usually somewhere on her face Short as around her eyes or her lips and sometimes hives on her body. She states last time she came to the ED was in November however that time she had swelling of her tongue and in her throat. She was given epinephrine subcutaneous and admitted to the hospital for 3 days. She states about 8 PM she ate a sandwich. About 30 minutes later she started having some itching and tingling of her lower lip and it started swelling. She put a cold compress on it and about 8:30 she took Benadryl and a Pepcid. She then noted she had some swelling of her top lip. She did go to sleep. When she woke up at 4 AM the swelling was gone in her upper lip however she still had persistent swelling of her lower lip, she states is not worse it's just not gone. She denies any swelling in her oral pharynx tonight, she denies shortness of breath or difficulty swallowing tonight. She states she hasn't seen an allergist b/o no insurance  PCP Purvis Kilts, MD   Past Medical History:  Diagnosis Date  . Bladder infection, chronic   . Contraceptive management 07/14/2015  . GERD (gastroesophageal reflux disease)   . Hidradenitis   . 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 Hypertension   . Renal disorder    chronic bladder infections  . Supervision of normal pregnancy in first trimester 05/03/2013  . Swelling     Patient Active Problem List   Diagnosis Date Noted  . 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 Contraceptive management 07/14/2015  . Angioedema 07/07/2015  . GERD without esophagitis 07/07/2015  . Urticaria 07/07/2015  . Hypoactive sexual desire 08/06/2014  . Mood  changes ([place]) 08/06/2014  . Hidradenitis suppurativa 04/23/2014  . Intertrigo labialis 10/30/2013  . Severe obesity (BMI >= 40) ([place]) 06/26/2013  . Marijuana use 06/24/2013    Past Surgical History:  Procedure Laterality Date  . TONSILLECTOMY      OB History    Gravida Para Term Preterm AB Living   _0 0 0 1   SAB TAB Ectopic Multiple Live Births   0 0 0 0 1       Home Medications    Prior to Admission medications   Medication Sig Start Date End Date Taking? Authorizing Provider  blood glucose meter kit and supplies KIT Dispense based on patient and insurance preference. Use up to four times daily as directed. (FOR ICD-9 250.00, 250.01). 07/08/15  Yes Kathie Dike, MD  diphenhydrAMINE (BENADRYL) 25 mg capsule Take 2 capsules (50 mg total) by mouth every 6 (six) hours as needed. Allergies 07/08/15  Yes Kathie Dike, MD  EPINEPHrine (EPI-PEN) 0.3 mg/0.3 mL DEVI Inject 0.3 mLs (0.3 mg total) into the muscle once. 07/08/15  Yes Kathie Dike, MD  famotidine (PEPCID) 20 MG tablet Take 1 tablet (20 mg total) by mouth 2 (two) times daily. 07/08/15  Yes Kathie Dike, MD  medroxyPROGESTERone (DEPO-PROVERA) 150 MG/ML injection Inject 1 mL (150 mg total) into the muscle every 3 (three) months. 12/30/15  Yes Estill Dooms, NP  fexofenadine (ALLEGRA) 180 MG  tablet Take 180 mg by mouth daily as needed for allergies or rhinitis.    Historical Provider, MD  predniSONE (DELTASONE) 20 MG tablet Take 3 po QD x 3d , then 2 po QD x 3d then 1 po QD x 3d 07/07/16   Rolland Porter, MD    Family History Family History  Problem Relation Age of Onset  . Diabetes Paternal Grandfather   . Heart disease Paternal Grandfather   . Diabetes Paternal Grandmother   . Heart disease Paternal Grandmother   . Diabetes Maternal Grandmother   . Heart disease Maternal Grandmother   . Diabetes Maternal Grandfather   . Heart disease Maternal Grandfather   . Heart disease Mother     Social History Social  History  Substance Use Topics  . Smoking status: Former Smoker    Packs/day: 10.00    Years: 2.00    Types: Cigarettes    Quit date: 04/04/2013  . Smokeless tobacco: Former Systems developer    Types: Chew  . Alcohol use 0.0 oz/week     Comment: occ  employed Smokes 7 cigs a day   Allergies   Nsaids; Aleve [naproxen sodium]; Amoxicillin; and Tylenol [acetaminophen]   Review of Systems Review of Systems  All other systems reviewed and are negative.    Physical Exam Updated Vital Signs BP 115/65 (BP Location: Left Arm)   Pulse 79   Temp 98.8 F (37.1 C) (Oral)   Resp 18   Ht _0  (1.753 m)   Wt 280 lb (127 kg)   SpO2 97%   BMI 41.35 kg/m   Vital signs normal    Physical Exam  Constitutional: She is oriented to person, place, and time. She appears well-developed and well-nourished.  Non-toxic appearance. She does not appear ill. No distress.  HENT:  Head: Normocephalic and atraumatic.  Right Ear: External ear normal.  Left Ear: External ear normal.  Nose: Nose normal. No mucosal edema or rhinorrhea.  Mouth/Throat: Uvula is midline, oropharynx is clear and moist and mucous membranes are normal. No dental abscesses or uvula swelling. No posterior oropharyngeal edema.  Patient is noted to have swelling of her lower lip, there is no intraoral swelling seen.  Eyes: Conjunctivae and EOM are normal. Pupils are equal, round, and reactive to light.  Neck: Normal range of motion and full passive range of motion without pain. Neck supple.  Cardiovascular: Normal rate, regular rhythm and normal heart sounds.  Exam reveals no gallop and no friction rub.   No murmur heard. Pulmonary/Chest: Effort normal and breath sounds normal. No respiratory distress. She has no wheezes. She has no rhonchi. She has no rales. She exhibits no tenderness and no crepitus.  Abdominal: Normal appearance. There is no rebound.  Musculoskeletal: Normal range of motion. She exhibits no edema or tenderness.  Moves  all extremities well.   Neurological: She is alert and oriented to person, place, and time. She has normal strength. No cranial nerve deficit.  Skin: Skin is warm, dry and intact. No rash noted. No erythema. No pallor.  Psychiatric: She has a normal mood and affect. Her speech is normal and behavior is normal. Her mood appears not anxious.  Nursing note and vitals reviewed.      ED Treatments / Results   Procedures Procedures (including critical care time)  Medications Ordered in ED Medications  EPINEPHrine (ADRENALIN) 1 MG/10ML injection 0.3 mg (0.3 mg Intramuscular Not Given 07/07/16 0624)  EPINEPHrine (ADRENALIN) 1 MG/ML (  Not Given 07/07/16 6761)  famotidine (PEPCID) tablet 20 mg (20 mg Oral Given 07/07/16 0616)  methylPREDNISolone sodium succinate (SOLU-MEDROL) 125 mg/2 mL injection 125 mg (125 mg Intramuscular Given 07/07/16 0621)  diphenhydrAMINE (BENADRYL) injection 25 mg (25 mg Intramuscular Given 07/07/16 0298)     Initial Impression / Assessment and Plan / ED Course  I have reviewed the triage vital signs and the nursing notes.  Pertinent labs & imaging results that were available during my care of the patient were reviewed by me and considered in my medical decision making (see chart for details).  Patient states the epinephrine has helped in the past. She was given epinephrine IM. She was also given Benadryl and Solu-Medrol IM. Patient refused IV. Recheck at 8:15 AM patient states she's doing better. Her lower lip still appears swollen to me but she states it's softer and she can move it easier now. She feels comfortable going home and taking oral prednisone and her Benadryl and Pepcid. We discussed seeing an allergist however she states she cannot afford to see the allergist.    Final Clinical Impressions(s) / ED Diagnoses   Final diagnoses:  Allergic reaction, initial encounter  Angioedema, initial encounter    New Prescriptions New Prescriptions   PREDNISONE  (DELTASONE) 20 MG TABLET    Take 3 po QD x 3d , then 2 po QD x 3d then 1 po QD x 3d    Plan discharge  Rolland Porter, MD, Barbette Or, MD 07/07/16 0830

## 2016-07-07 NOTE — Discharge Instructions (Signed)
Take the prednisone until gone. Continue your benadryl and pepcid. Return to the ED if you get worse again, you get swelling in your tongue or throat, or have difficulty breathing.

## 2016-07-07 NOTE — ED Triage Notes (Signed)
Recurrent allergic reaction to multiple foods and meds.  Pt took 2 benadryl at 1930 last night and otc pepcid with minimal relief.

## 2016-08-12 ENCOUNTER — Emergency Department (HOSPITAL_COMMUNITY)
Admission: EM | Admit: 2016-08-12 | Discharge: 2016-08-12 | Discharge status: Against Medical Advice | Payer: Medicaid Other

## 2016-08-12 NOTE — ED Triage Notes (Signed)
Pt's mother stated that she was going to take pt to eye doctor.  Pt left with steady gait with mother at side.

## 2016-09-26 ENCOUNTER — Ambulatory Visit: Payer: Medicaid Other

## 2016-09-27 ENCOUNTER — Ambulatory Visit (INDEPENDENT_AMBULATORY_CARE_PROVIDER_SITE_OTHER): Payer: Medicaid Other | Admitting: *Deleted

## 2016-09-27 ENCOUNTER — Encounter: Payer: Self-pay | Admitting: *Deleted

## 2016-09-27 DIAGNOSIS — Z308 Encounter for other contraceptive management: Secondary | ICD-10-CM

## 2016-09-27 DIAGNOSIS — Z3042 Encounter for surveillance of injectable contraceptive: Secondary | ICD-10-CM

## 2016-09-27 LAB — POCT URINE PREGNANCY: Preg Test, Ur: NEGATIVE

## 2016-09-27 MED ORDER — MEDROXYPROGESTERONE ACETATE 150 MG/ML IM SUSP
150.0000 mg | Freq: Once | INTRAMUSCULAR | Status: AC
  Administered 2016-09-27: 150 mg via INTRAMUSCULAR

## 2016-09-27 NOTE — Progress Notes (Signed)
Pt here for Depo injection, UPT resulted negative and injection given in Rt deltoid without complication.  Pt instructed to return in 12 weeks for next injection.

## 2016-11-14 ENCOUNTER — Emergency Department (HOSPITAL_COMMUNITY)
Admission: EM | Admit: 2016-11-14 | Discharge: 2016-11-14 | Disposition: A | Discharge status: Home / Self Care | Payer: Medicaid Other | Attending: Emergency Medicine | Admitting: Emergency Medicine

## 2016-11-14 ENCOUNTER — Encounter (HOSPITAL_COMMUNITY): Payer: Self-pay

## 2016-11-14 DIAGNOSIS — I10 Essential (primary) hypertension: Secondary | ICD-10-CM | POA: Insufficient documentation

## 2016-11-14 DIAGNOSIS — Z793 Long term (current) use of hormonal contraceptives: Secondary | ICD-10-CM | POA: Insufficient documentation

## 2016-11-14 DIAGNOSIS — T7840XA Allergy, unspecified, initial encounter: Secondary | ICD-10-CM | POA: Insufficient documentation

## 2016-11-14 DIAGNOSIS — T783XXA Angioneurotic edema, initial encounter: Secondary | ICD-10-CM

## 2016-11-14 DIAGNOSIS — F17210 Nicotine dependence, cigarettes, uncomplicated: Secondary | ICD-10-CM | POA: Insufficient documentation

## 2016-11-14 MED ORDER — DEXAMETHASONE SODIUM PHOSPHATE 10 MG/ML IJ SOLN
10.0000 mg | Freq: Once | INTRAMUSCULAR | Status: AC
  Administered 2016-11-14: 10 mg via INTRAMUSCULAR
  Filled 2016-11-14: qty 1

## 2016-11-14 MED ORDER — DIPHENHYDRAMINE HCL 25 MG PO CAPS
50.0000 mg | ORAL_CAPSULE | Freq: Once | ORAL | Status: AC
  Administered 2016-11-14: 50 mg via ORAL
  Filled 2016-11-14: qty 2

## 2016-11-14 MED ORDER — PREDNISONE 10 MG PO TABS: ORAL_TABLET | ORAL | 0 refills | Status: DC

## 2016-11-14 MED ORDER — FAMOTIDINE 20 MG PO TABS
20.0000 mg | ORAL_TABLET | Freq: Once | ORAL | Status: AC
  Administered 2016-11-14: 20 mg via ORAL
  Filled 2016-11-14: qty 1

## 2016-11-14 NOTE — ED Provider Notes (Signed)
[facility] [place] DEPT Provider Note   CSN: [id number] Arrival date & time: 11/14/16  4497     History   Chief Complaint Chief Complaint  Patient presents with  . Eye Pain    HPI Suzanne Short is a 21 y.o. female presenting with right periorbital swelling which she woke with this am.  She has a history of angioedema which She has had for many years and seems to be triggered by stressful events, endorsing having a very stressful work shift yesterday.  She has intermittent episodes of perioral and/or periorbital edema which she usually treats with pepcid and benadryl.  She has had no treatment this am, instead decided to come in given her right eyelids are significantly swollen and felt she would need a steroid shot.  She denies shortness of breath, mouth tongue or lip swelling, no throat tightness, cough, wheezing and denies rash.  She denies eye pain or injury.  She has had sinus congestion for the past several weeks including nasal discharge.  She denies sinus pain, fevers, chills or headache.   The history is provided by the patient and a parent.    Past Medical History:  Diagnosis Date  . Bladder infection, chronic   . Contraceptive management 07/14/2015  . GERD (gastroesophageal reflux disease)   . Hidradenitis   . 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 Hypertension   . Renal disorder    chronic bladder infections  . Supervision of normal pregnancy in first trimester 05/03/2013  . Swelling     Patient Active Problem List   Diagnosis Date Noted  . 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 Contraceptive management 07/14/2015  . Angioedema 07/07/2015  . GERD without esophagitis 07/07/2015  . Urticaria 07/07/2015  . Hypoactive sexual desire 08/06/2014  . Mood changes ([place]) 08/06/2014  . Hidradenitis suppurativa 04/23/2014  . Intertrigo labialis 10/30/2013  . Severe obesity (BMI >= 40) ([place]) 06/26/2013  . Marijuana use 06/24/2013    Past Surgical History:  Procedure Laterality Date  .  TONSILLECTOMY      OB History    Gravida Para Term Preterm AB Living   1 1 1  0 0 1   SAB TAB Ectopic Multiple Live Births   0 0 0 0 1       Home Medications    Prior to Admission medications   Medication Sig Start Date End Date Taking? Authorizing Provider  blood glucose meter kit and supplies KIT Dispense based on patient and insurance preference. Use up to four times daily as directed. (FOR ICD-9 250.00, 250.01). 07/08/15   Kathie Dike, MD  diphenhydrAMINE (BENADRYL) 25 mg capsule Take 2 capsules (50 mg total) by mouth every 6 (six) hours as needed. Allergies 07/08/15   Kathie Dike, MD  EPINEPHrine (EPI-PEN) 0.3 mg/0.3 mL DEVI Inject 0.3 mLs (0.3 mg total) into the muscle once. 07/08/15   Kathie Dike, MD  famotidine (PEPCID) 20 MG tablet Take 1 tablet (20 mg total) by mouth 2 (two) times daily. 07/08/15   Kathie Dike, MD  fexofenadine (ALLEGRA) 180 MG tablet Take 180 mg by mouth daily as needed for allergies or rhinitis.    [provider]  medroxyPROGESTERone (DEPO-PROVERA) 150 MG/ML injection Inject 1 mL (150 mg total) into the muscle every 3 (three) months. 12/30/15   Estill Dooms, NP  predniSONE (DELTASONE) 10 MG tablet Take 5 tabs daily for 2 days,  4 tabs daily for 2 days,  3 tabs daily for 2 days,  2 tabs daily for 2 days,  Then 1 tab daily for 2 days. 11/15/16   Evalee Jefferson, PA-C    Family History Family History  Problem Relation Age of Onset  . Diabetes Paternal Grandfather   . Heart disease Paternal Grandfather   . Diabetes Paternal Grandmother   . Heart disease Paternal Grandmother   . Diabetes Maternal Grandmother   . Heart disease Maternal Grandmother   . Diabetes Maternal Grandfather   . Heart disease Maternal Grandfather   . Heart disease Mother     Social History Social History  Substance Use Topics  . Smoking status: Current Every Day Smoker    Packs/day: 1.00    Years: 2.00    Types: Cigarettes    Last attempt to quit:  04/04/2013  . Smokeless tobacco: Former Systems developer    Types: Chew  . Alcohol use 0.0 oz/week     Comment: occ     Allergies   Nsaids; Aleve [naproxen sodium]; Amoxicillin; and Tylenol [acetaminophen]   Review of Systems Review of Systems  Constitutional: Negative for fever.  HENT: Positive for facial swelling and rhinorrhea. Negative for congestion, sore throat and trouble swallowing.   Eyes: Negative.   Respiratory: Negative for chest tightness, shortness of breath, wheezing and stridor.   Cardiovascular: Negative for chest pain.  Gastrointestinal: Negative for abdominal pain and nausea.  Genitourinary: Negative.   Musculoskeletal: Negative for arthralgias, joint swelling and neck pain.  Skin: Negative.  Negative for rash and wound.  Neurological: Negative for light-headedness and headaches.  Psychiatric/Behavioral: Negative.      Physical Exam Updated Vital Signs BP 131/85   Pulse 77   Temp 99.1 F (37.3 C) (Oral)   Resp 18   Wt 127 kg (280 lb)   LMP 11/10/2016   SpO2 92%   BMI 41.35 kg/m   Physical Exam  Constitutional: She appears well-developed and well-nourished.  HENT:  Head: Atraumatic.  Right Ear: Tympanic membrane and ear canal normal.  Left Ear: Tympanic membrane and ear canal normal.  Nose: Rhinorrhea present.  Mouth/Throat: Uvula is midline, oropharynx is clear and moist and mucous membranes are normal. No trismus in the jaw. No uvula swelling. No posterior oropharyngeal edema.  Eyes: Conjunctivae and EOM are normal. Pupils are equal, round, and reactive to light. Right conjunctiva is not injected. Left conjunctiva is not injected.  Right upper and lower eyelid with boggy edema, no tenderness, no erythema.  No conjunctival injection.  No discharge.  Neck: Normal range of motion.  Cardiovascular: Normal rate, regular rhythm, normal heart sounds and intact distal pulses.   Pulmonary/Chest: Effort normal and breath sounds normal. She has no wheezes.    Abdominal: Soft. Bowel sounds are normal. There is no tenderness.  Musculoskeletal: Normal range of motion.  Neurological: She is alert.  Skin: Skin is warm and dry.  Psychiatric: She has a normal mood and affect.  Nursing note and vitals reviewed.    ED Treatments / Results  Labs (all labs ordered are listed, but only abnormal results are displayed) Labs Reviewed - No data to display  EKG  EKG Interpretation None       Radiology No results found.  Procedures Procedures (including critical care time)  Medications Ordered in ED Medications  dexamethasone (DECADRON) injection 10 mg (10 mg Intramuscular Given 11/14/16 0838)  diphenhydrAMINE (BENADRYL) capsule 50 mg (50 mg Oral Given 11/14/16 0837)  famotidine (PEPCID) tablet 20 mg (20 mg Oral Given 11/14/16 0837)     Initial Impression / Assessment and Plan / ED Course  I have reviewed the triage vital signs and the nursing notes.  Pertinent labs & imaging results that were available during my care of the patient were reviewed by me and considered in my medical decision making (see chart for details).     Patient with a long-standing history of intermittent edema, today's event appears limited to her right.  Orbital area.  She was given Decadron, Benadryl and Pepcid.  Observed in ED with no worsening symptoms, eyelid swelling actually improving.  She does have an EpiPen at home and is familiar with its use.  She was prescribed a prednisone taper, will take her home Benadryl and Pepcid.  Advise when necessary follow-up, returning here for any worsening symptoms.  Final Clinical Impressions(s) / ED Diagnoses   Final diagnoses:  Allergic reaction, initial encounter  Angioedema, initial encounter    New Prescriptions New Prescriptions   PREDNISONE (DELTASONE) 10 MG TABLET    Take 5 tabs daily for 2 days,  4 tabs daily for 2 days,  3 tabs daily for 2 days,  2 tabs daily for 2 days,  Then 1 tab daily for 2 days.     Evalee Jefferson, PA-C 11/14/16 6378    Noemi Chapel, MD 11/17/16 [phone]

## 2016-11-14 NOTE — Discharge Instructions (Signed)
Start taking your prednisone taper tomorrow.  Take your next dose of Pepcid this evening and continue taking this medicine twice daily for the next 5 days.  Also take your Benadryl 2 tablets every 6 hours for at least the next 48 hours, longer if your symptoms persist.  Return here for any worsening symptoms as discussed.  Use your EpiPen if you develop shortness of breath, throat or mouth swelling.

## 2016-11-14 NOTE — ED Triage Notes (Signed)
Pt reports that she woke up with her right eye swollen. Also swelling noted to upper left lid  Unsure what has caused swelling. Pt reports she has sinus drainage, cough and congestion approx 2 weeks ago. Pt started taking mucinex DM and neb tx 2 days ago. Pt also reports has issues with allergic reactions and swelling

## 2016-12-13 ENCOUNTER — Ambulatory Visit (INDEPENDENT_AMBULATORY_CARE_PROVIDER_SITE_OTHER): Payer: Medicaid Other | Admitting: Adult Health

## 2016-12-13 ENCOUNTER — Encounter: Payer: Self-pay | Admitting: Adult Health

## 2016-12-13 ENCOUNTER — Other Ambulatory Visit (HOSPITAL_COMMUNITY)
Admission: RE | Admit: 2016-12-13 | Discharge: 2016-12-13 | Disposition: A | Discharge status: Home / Self Care | Payer: Medicaid Other | Source: Ambulatory Visit | Attending: Adult Health | Admitting: Adult Health

## 2016-12-13 VITALS — BP 120/80 | HR 76 | Ht 67.4 in | Wt 295.0 lb

## 2016-12-13 DIAGNOSIS — Z8742 Personal history of other diseases of the female genital tract: Secondary | ICD-10-CM

## 2016-12-13 DIAGNOSIS — Z309 Encounter for contraceptive management, unspecified: Secondary | ICD-10-CM

## 2016-12-13 DIAGNOSIS — Z01419 Encounter for gynecological examination (general) (routine) without abnormal findings: Secondary | ICD-10-CM | POA: Insufficient documentation

## 2016-12-13 DIAGNOSIS — N870 Mild cervical dysplasia: Secondary | ICD-10-CM | POA: Insufficient documentation

## 2016-12-13 DIAGNOSIS — L732 Hidradenitis suppurativa: Secondary | ICD-10-CM

## 2016-12-13 DIAGNOSIS — Z3009 Encounter for other general counseling and advice on contraception: Secondary | ICD-10-CM

## 2016-12-13 DIAGNOSIS — Z3042 Encounter for surveillance of injectable contraceptive: Secondary | ICD-10-CM | POA: Insufficient documentation

## 2016-12-13 MED ORDER — MEDROXYPROGESTERONE ACETATE 150 MG/ML IM SUSP: 150.0000 mg | INTRAMUSCULAR | 3 refills | Status: DC

## 2016-12-13 NOTE — Progress Notes (Signed)
Patient ID: Randie HeinzJessica Harbin, female   DOB: Jun 13, 1995, 21 y.o.   MRN: [id number] History of Present Illness: Shanda BumpsJessica is a 21 year old white female in for well woman gyn exam and pap, her pap last year was ASCUS,+HPV and is did not come back for colpo.  PCP is Dr Phillips OdorGolding. Her son turns 3 today.    Current Medications, Allergies, Past Medical History, Past Surgical History, Family History and Social History were reviewed in [facility] electronic medical record.     Review of Systems:  Patient denies any different headaches, hearing loss, fatigue, blurred vision, shortness of breath, chest pain, abdominal pain, problems with bowel movements, urination, or intercourse. No joint pain or mood swings. Happy with depo.  Physical Exam:BP 120/80 (BP Location: Left Arm, Patient Position: Sitting, Cuff Size: Large)   Pulse 76   Ht 5' 7.4" (1.712 m)   Wt 295 lb (133.8 kg)   BMI 45.66 kg/m  General:  Well developed, well nourished, no acute distress Skin:  Warm and dry,multiple tattoos Neck:  Midline trachea, normal thyroid, good ROM, no lymphadenopathy Lungs; Clear to auscultation bilaterally Breast:  No dominant palpable mass, retraction, or nipple discharge, has scars from hidradenitis under both breasts  Cardiovascular: Regular rate and rhythm Abdomen:  Soft, non tender, no hepatosplenomegaly, has scab at navel where had pierced and reacted to navel ring and then removed it. Pelvic:  External genitalia is normal in appearance,+scarring from hidradenitis between thighs.  The vagina is normal in appearance. Urethra has no lesions or masses. The cervix is bulbous.Pap with HPV performed.  Uterus is felt to be normal size, shape, and contour.  No adnexal masses or tenderness noted.Bladder is non tender, no masses felt. Extremities/musculoskeletal:  No swelling or varicosities noted, no clubbing or cyanosis Psych:  No mood changes, alert and cooperative,seems happy PHQ 2 score 1.  Impression: 1.  Encounter for routine gynecological examination with Papanicolaou smear of cervix   2. History of abnormal cervical Pap smear   3. Encounter for surveillance of injectable contraceptive   4. Family planning   5. Hidradenitis       Plan: Thin prep pap with reflex HPV and  GC/CHL sent Check HIV and RPR  Rx Depo provera 150 mg, disp.# 1 for IM injection every 3 months in office,with 3 refills Physical in 1 year, pap in 2 years, if normal

## 2016-12-13 NOTE — Addendum Note (Signed)
Addended by: Federico FlakeNES, PEGGY A on: 12/13/2016 12:08 PM   Modules accepted: Orders

## 2016-12-14 LAB — HIV ANTIBODY (ROUTINE TESTING W REFLEX): HIV SCREEN 4TH GENERATION: NONREACTIVE

## 2016-12-14 LAB — RPR: RPR Ser Ql: NONREACTIVE

## 2016-12-15 ENCOUNTER — Telehealth: Payer: Self-pay | Admitting: Adult Health

## 2016-12-15 ENCOUNTER — Encounter: Payer: Self-pay | Admitting: Adult Health

## 2016-12-15 DIAGNOSIS — R87619 Unspecified abnormal cytological findings in specimens from cervix uteri: Secondary | ICD-10-CM | POA: Insufficient documentation

## 2016-12-15 DIAGNOSIS — R87612 Low grade squamous intraepithelial lesion on cytologic smear of cervix (LGSIL): Secondary | ICD-10-CM

## 2016-12-15 HISTORY — DX: Unspecified abnormal cytological findings in specimens from cervix uteri: R87.619

## 2016-12-15 LAB — CYTOLOGY - PAP
Adequacy: ABSENT — AB
Chlamydia: NEGATIVE
HPV: NOT DETECTED
Neisseria Gonorrhea: NEGATIVE

## 2016-12-15 NOTE — Telephone Encounter (Signed)
Left message to call me about pap, if she calls, needs colpo

## 2016-12-19 ENCOUNTER — Telehealth: Payer: Self-pay | Admitting: Adult Health

## 2016-12-19 NOTE — Telephone Encounter (Signed)
Pt aware pap shows LSIL and needs colpo, she will decide and call back,.

## 2016-12-19 NOTE — Telephone Encounter (Signed)
left message with mom to have pt call me, to discuss pap, needs colpo, she is self pay,numbers given for colpo prices.

## 2016-12-20 ENCOUNTER — Ambulatory Visit (INDEPENDENT_AMBULATORY_CARE_PROVIDER_SITE_OTHER): Payer: Medicaid Other

## 2016-12-20 VITALS — Wt 297.0 lb

## 2016-12-20 DIAGNOSIS — Z3049 Encounter for surveillance of other contraceptives: Secondary | ICD-10-CM | POA: Diagnosis not present

## 2016-12-20 DIAGNOSIS — Z3042 Encounter for surveillance of injectable contraceptive: Secondary | ICD-10-CM

## 2016-12-20 DIAGNOSIS — Z3202 Encounter for pregnancy test, result negative: Secondary | ICD-10-CM | POA: Diagnosis not present

## 2016-12-20 LAB — POCT URINE PREGNANCY: PREG TEST UR: NEGATIVE

## 2016-12-20 MED ORDER — MEDROXYPROGESTERONE ACETATE 150 MG/ML IM SUSP
150.0000 mg | Freq: Once | INTRAMUSCULAR | Status: AC
  Administered 2016-12-20: 150 mg via INTRAMUSCULAR

## 2016-12-20 NOTE — Progress Notes (Signed)
PT here for depo shot 150 mg IM given LT Deltoid, tolerated well. Return 12 weeks for next shot. Pad CMA 

## 2017-01-03 ENCOUNTER — Other Ambulatory Visit: Payer: Self-pay | Admitting: Obstetrics & Gynecology

## 2017-01-03 ENCOUNTER — Encounter: Payer: Self-pay | Admitting: Obstetrics & Gynecology

## 2017-01-03 ENCOUNTER — Ambulatory Visit (INDEPENDENT_AMBULATORY_CARE_PROVIDER_SITE_OTHER): Payer: Self-pay | Admitting: Obstetrics & Gynecology

## 2017-01-03 VITALS — BP 130/78 | HR 89 | Ht 68.0 in | Wt 295.0 lb

## 2017-01-03 DIAGNOSIS — N870 Mild cervical dysplasia: Secondary | ICD-10-CM

## 2017-01-03 DIAGNOSIS — Z3202 Encounter for pregnancy test, result negative: Secondary | ICD-10-CM

## 2017-01-03 LAB — POCT URINE PREGNANCY: Preg Test, Ur: NEGATIVE

## 2017-01-03 MED ORDER — SILVER SULFADIAZINE 1 % EX CREA: TOPICAL_CREAM | CUTANEOUS | 11 refills | Status: DC

## 2017-01-03 NOTE — Addendum Note (Signed)
Addended by: Tish Frederickson A on: 01/03/2017 04:18 PM   Modules accepted: Orders

## 2017-01-03 NOTE — Progress Notes (Signed)
Colposcopy Procedure Note:  Colposcopy Procedure Note  Indications: Pap smear 1 months ago showed: low-grade squamous intraepithelial neoplasia (LGSIL - encompassing HPV,mild dysplasia,CIN I). The prior pap showed ASCUS with POSITIVE high risk HPV.  Prior cervical/vaginal disease: na. Prior cervical treatment: no treatment.  Smoker:  Yes.   New sexual partner:  No.  :   History of abnormal Pap: yes  Procedure Details  The risks and benefits of the procedure and Written informed consent obtained.  Speculum placed in vagina and excellent visualization of cervix achieved, cervix swabbed x 3 with acetic acid solution.  Findings: Cervix: visible lesion(s) at 12 o'clock, acetowhite lesion(s) noted at 12 o'clock, punctation noted at 12 o'clock and mosaicism noted at 12 o'clock; SCJ visualized - lesion at 12-1 o'clock, cervical biopsies taken at 12-1 o'clock, specimen labelled and sent to pathology and hemostasis achieved with Monsel's solution. Vaginal inspection: normal without visible lesions. Vulvar colposcopy: vulvar colposcopy not performed.  Specimens: cervical biopsy x 1  Complications: none.  Plan: Specimens labelled and sent to Pathology. Send my chart message regarding reuslts

## 2017-01-06 ENCOUNTER — Encounter (HOSPITAL_COMMUNITY): Payer: Self-pay | Admitting: *Deleted

## 2017-01-06 ENCOUNTER — Emergency Department (HOSPITAL_COMMUNITY)
Admission: EM | Admit: 2017-01-06 | Discharge: 2017-01-06 | Disposition: A | Discharge status: Home / Self Care | Payer: Medicaid Other | Attending: Emergency Medicine | Admitting: Emergency Medicine

## 2017-01-06 DIAGNOSIS — F17210 Nicotine dependence, cigarettes, uncomplicated: Secondary | ICD-10-CM | POA: Insufficient documentation

## 2017-01-06 DIAGNOSIS — Y999 Unspecified external cause status: Secondary | ICD-10-CM | POA: Insufficient documentation

## 2017-01-06 DIAGNOSIS — X58XXXA Exposure to other specified factors, initial encounter: Secondary | ICD-10-CM | POA: Insufficient documentation

## 2017-01-06 DIAGNOSIS — Z79899 Other long term (current) drug therapy: Secondary | ICD-10-CM | POA: Insufficient documentation

## 2017-01-06 DIAGNOSIS — T7840XA Allergy, unspecified, initial encounter: Secondary | ICD-10-CM

## 2017-01-06 DIAGNOSIS — Y939 Activity, unspecified: Secondary | ICD-10-CM | POA: Insufficient documentation

## 2017-01-06 DIAGNOSIS — Y929 Unspecified place or not applicable: Secondary | ICD-10-CM | POA: Insufficient documentation

## 2017-01-06 DIAGNOSIS — I10 Essential (primary) hypertension: Secondary | ICD-10-CM | POA: Insufficient documentation

## 2017-01-06 MED ORDER — METHYLPREDNISOLONE SODIUM SUCC 125 MG IJ SOLR
125.0000 mg | Freq: Once | INTRAMUSCULAR | Status: AC
  Administered 2017-01-06: 125 mg via INTRAVENOUS
  Filled 2017-01-06: qty 2

## 2017-01-06 MED ORDER — PREDNISONE 10 MG PO TABS: ORAL_TABLET | ORAL | 0 refills | Status: DC

## 2017-01-06 NOTE — ED Triage Notes (Signed)
Swelling of lip, recurrent, took pepcid and benadryl prior to arrival

## 2017-01-06 NOTE — ED Provider Notes (Signed)
[facility] DEPT Provider Note   CSN: [id number] Arrival date & time: 01/06/17  0904     History   Chief Complaint Chief Complaint  Patient presents with  . Oral Swelling    HPI Suzanne Short is a 21 y.o. female.  Patient is a 21 year old female who presents to the emergency department with complaint of lip swelling.  Patient states she has allergies. She states she has some problems with swelling or allergic reaction about every month. She has tried to do the process of elimination but has been unsuccessful in finding what she is allergic to. She states that she has not been to an allergist, because she does not have proper insurance and does not have the finances at this time. She has been using Benadryl and Pepcid and she states that usually this helps, on this particular occasion she began to notice some tingling and swelling of her lip approximately 6 this morning. She noticed a few hives present, but these went away. She took the Pepcid and the Benadryl. She states that around 9 PM she still noticed some swelling of the lip. She thought she should come to the emergency department to avoid a full reaction. It is of note that she has required hospitalization because of allergic reaction.   The history is provided by the patient.    Past Medical History:  Diagnosis Date  . Abnormal Pap smear of cervix 12/15/2016   LSIL, get colpo  . Bladder infection, chronic   . Contraceptive management 07/14/2015  . GERD (gastroesophageal reflux disease)   . Hidradenitis   . 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 Hypertension   . Renal disorder    chronic bladder infections  . Supervision of normal pregnancy in first trimester 05/03/2013  . Swelling   . Vaginal Pap smear, abnormal     Patient Active Problem List   Diagnosis Date Noted  . Abnormal Pap smear of cervix 12/15/2016  . History of abnormal cervical Pap smear 12/13/2016  . Encounter for surveillance of injectable contraceptive  12/13/2016  . 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 Contraceptive management 07/14/2015  . Angioedema 07/07/2015  . GERD without esophagitis 07/07/2015  . Urticaria 07/07/2015  . Family planning 09/18/2014  . Hypoactive sexual desire 08/06/2014  . Mood changes ([place]) 08/06/2014  . Hidradenitis 04/23/2014  . Intertrigo labialis 10/30/2013  . Severe obesity (BMI >= 40) ([place]) 06/26/2013  . Marijuana use 06/24/2013    Past Surgical History:  Procedure Laterality Date  . TONSILLECTOMY      OB History    Gravida Para Term Preterm AB Living   1 1 1  0 0 1   SAB TAB Ectopic Multiple Live Births   0 0 0 0 1       Home Medications    Prior to Admission medications   Medication Sig Start Date End Date Taking? Authorizing Provider  blood glucose meter kit and supplies KIT Dispense based on patient and insurance preference. Use up to four times daily as directed. (FOR ICD-9 250.00, 250.01). 07/08/15  Yes Kathie Dike, MD  diphenhydrAMINE (BENADRYL) 25 mg capsule Take 2 capsules (50 mg total) by mouth every 6 (six) hours as needed. Allergies 07/08/15  Yes Kathie Dike, MD  EPINEPHrine (EPI-PEN) 0.3 mg/0.3 mL DEVI Inject 0.3 mLs (0.3 mg total) into the muscle once. 07/08/15  Yes Kathie Dike, MD  famotidine (PEPCID) 20 MG tablet Take 1 tablet (20 mg total) by mouth 2 (two) times daily. 07/08/15  Yes Kathie Dike, MD  fexofenadine (ALLEGRA) 180 MG tablet Take 180 mg by mouth daily as needed for allergies or rhinitis.   Yes [provider]  medroxyPROGESTERone (DEPO-PROVERA) 150 MG/ML injection Inject 1 mL (150 mg total) into the muscle every 3 (three) months. 12/13/16  Yes Estill Dooms, NP  silver sulfADIAZINE (SILVADENE) 1 % cream Use to area 3-4 times per day Patient not taking: Reported on 01/06/2017 01/03/17   Florian Buff, MD    Family History Family History  Problem Relation Age of Onset  . Diabetes Paternal Grandfather   . Heart disease Paternal Grandfather   .  Diabetes Paternal Grandmother   . Heart disease Paternal Grandmother   . Diabetes Maternal Grandmother   . Heart disease Maternal Grandmother   . Diabetes Maternal Grandfather   . Heart disease Maternal Grandfather   . Heart disease Mother     Social History Social History  Substance Use Topics  . Smoking status: Current Every Day Smoker    Packs/day: 1.00    Years: 2.00    Types: Cigarettes    Last attempt to quit: 04/04/2013  . Smokeless tobacco: Former Systems developer    Types: Chew  . Alcohol use 0.0 oz/week     Comment: occ     Allergies   Nsaids; Aleve [naproxen sodium]; Amoxicillin; and Tylenol [acetaminophen]   Review of Systems Review of Systems  Constitutional: Negative for activity change.       All ROS Neg except as noted in HPI  HENT: Negative for nosebleeds.        Lip swelling  Eyes: Negative for photophobia and discharge.  Respiratory: Negative for cough, shortness of breath and wheezing.   Cardiovascular: Negative for chest pain and palpitations.  Gastrointestinal: Negative for abdominal pain and blood in stool.  Genitourinary: Negative for dysuria, frequency and hematuria.  Musculoskeletal: Negative for arthralgias, back pain and neck pain.  Skin: Negative.        Hives  Neurological: Negative for dizziness, seizures and speech difficulty.  Psychiatric/Behavioral: Negative for confusion and hallucinations.     Physical Exam Updated Vital Signs BP 133/75 (BP Location: Right Arm)   Pulse 83   Temp 98.2 F (36.8 C) (Oral)   Resp 20   Ht 5' 9"  (1.753 m)   Wt 131.5 kg (290 lb)   SpO2 97%   BMI 42.83 kg/m   Physical Exam  HENT:  There is a small area of swelling in the left corner of the upper lip. There is no facial asymmetry or facial swelling other than the lip. The airway is patent. The tongue is in the midline. The uvula is in the midline. There is no swelling under the tongue.  Pulmonary/Chest: Effort normal and breath sounds normal. No  respiratory distress. She has no wheezes. She has no rales.  Skin: No rash noted.     ED Treatments / Results  Labs (all labs ordered are listed, but only abnormal results are displayed) Labs Reviewed - No data to display  EKG  EKG Interpretation None       Radiology No results found.  Procedures Procedures (including critical care time)  Medications Ordered in ED Medications - No data to display   Initial Impression / Assessment and Plan / ED Course  I have reviewed the triage vital signs and the nursing notes.  Pertinent labs & imaging results that were available during my care of the patient were reviewed by me and considered in my medical decision making (see chart  for details).      Final Clinical Impressions(s) / ED Diagnoses MDM Patient is unsure of what she may be allergic to. She states that she has not used any new products. She's not taking any new foods. She's not been around any new environments.  Patient states in the past she still had any swelling or itching after Benadryl and Pepcid that the steroid tapers have been successful. Patient has had 50 mg of Benadryl and 20 mg of Pepcid prior to arrival to the emergency department. IV Decadron given to the patient. Will observe here in the emergency department for any advancing of allergic reaction.   Patient observe for nearly an hour here in the emergency department without any change appreciated. Recheck after IV steroids reveals the patient speaking in complete sentences. The symmetrical rise and fall of the chest. There's no rales appreciated on lung examination. There is no hives or rash noted on the skin. The small area of swelling of the left corner of the lip seems to have resolved. The patient states that the tingling that she was feeling in this area has also resolved.  The patient will be discharged home. She will continue to use her Benadryl and Pepcid. A prescription for steroid taper will be given  to the patient. I have strongly encouraged patient to see an allergist as soon as possible for allergy evaluation. The patient will return to the emergency department immediately if any changes, problems, or concerns. Patient is in agreement with this plan.    Final diagnoses:  Allergic reaction, initial encounter    New Prescriptions New Prescriptions   PREDNISONE (DELTASONE) 10 MG TABLET    5,4,3,2,1   Take with food.     Lily Kocher, PA-C 01/06/17 1048    Milton Ferguson, MD 01/07/17 [phone]

## 2017-01-06 NOTE — Discharge Instructions (Signed)
Your oxygen level is 97% on room air. Your vital signs within normal limits. We observed her here in the emergency department without significant change in your clinical condition. Please use the prednisone taper daily with food. Continue to use Benadryl and Pepcid should the hives, swelling, or symptoms of allergic reaction reoccur. Please return to the emergency department immediately if any changes, problems, or concerns. I've included the name and address and phone number of the allergy specialist here in town. Your strongly encouraged to see the allergy specialist as soon as possible for testing.

## 2017-02-18 ENCOUNTER — Encounter (HOSPITAL_COMMUNITY): Payer: Self-pay | Admitting: Emergency Medicine

## 2017-02-18 ENCOUNTER — Emergency Department (HOSPITAL_COMMUNITY)
Admission: EM | Admit: 2017-02-18 | Discharge: 2017-02-18 | Disposition: A | Discharge status: Home / Self Care | Payer: Self-pay | Attending: Emergency Medicine | Admitting: Emergency Medicine

## 2017-02-18 DIAGNOSIS — Z79899 Other long term (current) drug therapy: Secondary | ICD-10-CM | POA: Insufficient documentation

## 2017-02-18 DIAGNOSIS — F17210 Nicotine dependence, cigarettes, uncomplicated: Secondary | ICD-10-CM | POA: Insufficient documentation

## 2017-02-18 DIAGNOSIS — I10 Essential (primary) hypertension: Secondary | ICD-10-CM | POA: Insufficient documentation

## 2017-02-18 DIAGNOSIS — T7840XA Allergy, unspecified, initial encounter: Secondary | ICD-10-CM

## 2017-02-18 DIAGNOSIS — Z87892 Personal history of anaphylaxis: Secondary | ICD-10-CM | POA: Insufficient documentation

## 2017-02-18 DIAGNOSIS — R600 Localized edema: Secondary | ICD-10-CM | POA: Insufficient documentation

## 2017-02-18 MED ORDER — PREDNISONE 10 MG PO TABS: ORAL_TABLET | ORAL | 0 refills | Status: DC

## 2017-02-18 MED ORDER — METHYLPREDNISOLONE SODIUM SUCC 125 MG IJ SOLR
125.0000 mg | Freq: Once | INTRAMUSCULAR | Status: AC
  Administered 2017-02-18: 125 mg via INTRAVENOUS
  Filled 2017-02-18: qty 2

## 2017-02-18 NOTE — Discharge Instructions (Signed)
Take the entire course of prednisone as discussed.  Return here immediately for any worsening symptoms.continue taking your Benadryl 50 mg every 6 hours and Pepcid 20 mg twice daily until this episode is completely resolved.

## 2017-02-18 NOTE — ED Provider Notes (Signed)
[facility] DEPT Provider Note   CSN: [id number] Arrival date & time: 02/18/17  0825     History   Chief Complaint Chief Complaint  Patient presents with  . Facial Swelling    HPI Suzanne Short is a 21 y.o. female presenting with left facial swelling which initially woke her around 2 AM today.  She has a history of intermittent angioedema, known to be caused by NSAIDs, Tylenol and other unknown triggers as she denies any exposure to these medications.  She states this symptom has been present since she was a child and reports at least 2 episodes per month of this symptom.  She generally treats herself with Benadryl 50 mg and Pepcid 20 mg and took these medications at 7 AM today.  Her symptoms have not worsened since that time, but have not improved either.  She denies shortness of breath, cough, mouth tongue or throat swelling.  She also denies any mouth or dental pain.  HPI  Past Medical History:  Diagnosis Date  . Abnormal Pap smear of cervix 12/15/2016   LSIL, get colpo  . Bladder infection, chronic   . Contraceptive management 07/14/2015  . GERD (gastroesophageal reflux disease)   . Hidradenitis   . 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 Hypertension   . Renal disorder    chronic bladder infections  . Supervision of normal pregnancy in first trimester 05/03/2013  . Swelling   . Vaginal Pap smear, abnormal     Patient Active Problem List   Diagnosis Date Noted  . Abnormal Pap smear of cervix 12/15/2016  . History of abnormal cervical Pap smear 12/13/2016  . Encounter for surveillance of injectable contraceptive 12/13/2016  . 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 Contraceptive management 07/14/2015  . Angioedema 07/07/2015  . GERD without esophagitis 07/07/2015  . Urticaria 07/07/2015  . Family planning 09/18/2014  . Hypoactive sexual desire 08/06/2014  . Mood changes 08/06/2014  . Hidradenitis 04/23/2014  . Intertrigo labialis 10/30/2013  . Severe obesity (BMI >= 40) ([place])  06/26/2013  . Marijuana use 06/24/2013    Past Surgical History:  Procedure Laterality Date  . TONSILLECTOMY      OB History    Gravida Para Term Preterm AB Living   1 1 1  0 0 1   SAB TAB Ectopic Multiple Live Births   0 0 0 0 1       Home Medications    Prior to Admission medications   Medication Sig Start Date End Date Taking? Authorizing Provider  blood glucose meter kit and supplies KIT Dispense based on patient and insurance preference. Use up to four times daily as directed. (FOR ICD-9 250.00, 250.01). 07/08/15   Kathie Dike, MD  diphenhydrAMINE (BENADRYL) 25 mg capsule Take 2 capsules (50 mg total) by mouth every 6 (six) hours as needed. Allergies 07/08/15   Kathie Dike, MD  EPINEPHrine (EPI-PEN) 0.3 mg/0.3 mL DEVI Inject 0.3 mLs (0.3 mg total) into the muscle once. 07/08/15   Kathie Dike, MD  famotidine (PEPCID) 20 MG tablet Take 1 tablet (20 mg total) by mouth 2 (two) times daily. 07/08/15   Kathie Dike, MD  fexofenadine (ALLEGRA) 180 MG tablet Take 180 mg by mouth daily as needed for allergies or rhinitis.    [provider]  medroxyPROGESTERone (DEPO-PROVERA) 150 MG/ML injection Inject 1 mL (150 mg total) into the muscle every 3 (three) months. 12/13/16   Estill Dooms, NP  predniSONE (DELTASONE) 10 MG tablet Take 6 tablets day one, 5 tablets day  two, 4 tablets day three, 3 tablets day four, 2 tablets day five, then 1 tablet day six 02/18/17   Annya Lizana, Almyra Free, PA-C  silver sulfADIAZINE (SILVADENE) 1 % cream Use to area 3-4 times per day Patient not taking: Reported on 01/06/2017 01/03/17   Florian Buff, MD    Family History Family History  Problem Relation Age of Onset  . Diabetes Paternal Grandfather   . Heart disease Paternal Grandfather   . Diabetes Paternal Grandmother   . Heart disease Paternal Grandmother   . Diabetes Maternal Grandmother   . Heart disease Maternal Grandmother   . Diabetes Maternal Grandfather   . Heart disease Maternal  Grandfather   . Heart disease Mother     Social History Social History  Substance Use Topics  . Smoking status: Current Every Day Smoker    Packs/day: 1.50    Years: 2.00    Types: Cigarettes  . Smokeless tobacco: Former Systems developer    Types: Chew  . Alcohol use 0.0 oz/week     Comment: occ     Allergies   Nsaids; Aleve [naproxen sodium]; Amoxicillin; and Tylenol [acetaminophen]   Review of Systems Review of Systems  Constitutional: Negative for fever.  HENT: Positive for facial swelling. Negative for congestion, sore throat, trouble swallowing and voice change.   Eyes: Negative.   Respiratory: Negative for chest tightness, shortness of breath, wheezing and stridor.   Cardiovascular: Negative for chest pain.  Gastrointestinal: Negative for abdominal pain and nausea.  Genitourinary: Negative.   Musculoskeletal: Negative for arthralgias, joint swelling and neck pain.  Skin: Negative.  Negative for rash and wound.  Neurological: Negative for dizziness, weakness, light-headedness, numbness and headaches.  Psychiatric/Behavioral: Negative.      Physical Exam Updated Vital Signs BP (!) 154/88 (BP Location: Left Arm)   Pulse 94   Temp 97.7 F (36.5 C) (Oral)   Resp 18   Ht 5' 8"  (1.727 m)   Wt 131.5 kg (290 lb)   SpO2 98%   BMI 44.09 kg/m   Physical Exam  Constitutional: She appears well-developed and well-nourished.  HENT:  Head: Atraumatic.  Nose: Nose normal.  Mouth/Throat: Uvula is midline, oropharynx is clear and moist and mucous membranes are normal.  Soft edema noted to left cheek to infra orbital space.  There is no erythema and no tenderness to palpation. She has no trismus, no tongue or posterior pharynx edema.  Eyes: Conjunctivae are normal.  Neck: Normal range of motion and phonation normal.  Cardiovascular: Normal rate, regular rhythm, normal heart sounds and intact distal pulses.   Pulmonary/Chest: Effort normal and breath sounds normal. She has no  wheezes.  No stridor  Abdominal: Soft. Bowel sounds are normal. There is no tenderness.  Musculoskeletal: Normal range of motion.  Neurological: She is alert.  Skin: Skin is warm and dry.  Psychiatric: She has a normal mood and affect.  Nursing note and vitals reviewed.    ED Treatments / Results  Labs (all labs ordered are listed, but only abnormal results are displayed) Labs Reviewed - No data to display  EKG  EKG Interpretation None       Radiology No results found.  Procedures Procedures (including critical care time)  Medications Ordered in ED Medications  methylPREDNISolone sodium succinate (SOLU-MEDROL) 125 mg/2 mL injection 125 mg (125 mg Intravenous Given 02/18/17 0928)     Initial Impression / Assessment and Plan / ED Course  I have reviewed the triage vital signs and the nursing notes.  Pertinent labs & imaging results that were available during my care of the patient were reviewed by me and considered in my medical decision making (see chart for details).     Patient was given IV Solu-Medrol and observed in department.  She had improving facial edema prior to discharge and no new areas of edema, no hives, shortness of breath, mouth or lip edema.  She felt safe for discharge home.  She does have an EpiPen at home if needed area she was encouraged to return here immediately for any worsening symptoms.  She is put on a six-day prednisone taper and will continue using her Benadryl and Pepcid.  Final Clinical Impressions(s) / ED Diagnoses   Final diagnoses:  Allergic reaction, initial encounter    New Prescriptions New Prescriptions   PREDNISONE (DELTASONE) 10 MG TABLET    Take 6 tablets day one, 5 tablets day two, 4 tablets day three, 3 tablets day four, 2 tablets day five, then 1 tablet day six     Evalee Jefferson, Hershal Coria 02/18/17 1015    Forde Dandy, MD 02/18/17 1623

## 2017-02-18 NOTE — ED Triage Notes (Addendum)
Pt reports woke up at 2am and reports left eye and cheek swelling. Pt reports woke back up at 6am and reports face still swollen. Pt reports took benadryl and pepcid at home with no relief. Pt reports history of same. Pt reports has been unable to follow-up with allergist.pt denies any new self care products.airway patent.

## 2017-02-26 ENCOUNTER — Encounter (HOSPITAL_COMMUNITY): Payer: Self-pay | Admitting: Emergency Medicine

## 2017-02-26 ENCOUNTER — Emergency Department (HOSPITAL_COMMUNITY)
Admission: EM | Admit: 2017-02-26 | Discharge: 2017-02-26 | Disposition: A | Discharge status: Home / Self Care | Payer: Self-pay | Attending: Emergency Medicine | Admitting: Emergency Medicine

## 2017-02-26 DIAGNOSIS — R600 Localized edema: Secondary | ICD-10-CM | POA: Insufficient documentation

## 2017-02-26 DIAGNOSIS — Z79899 Other long term (current) drug therapy: Secondary | ICD-10-CM | POA: Insufficient documentation

## 2017-02-26 DIAGNOSIS — T7840XA Allergy, unspecified, initial encounter: Secondary | ICD-10-CM

## 2017-02-26 DIAGNOSIS — F17210 Nicotine dependence, cigarettes, uncomplicated: Secondary | ICD-10-CM | POA: Insufficient documentation

## 2017-02-26 DIAGNOSIS — I10 Essential (primary) hypertension: Secondary | ICD-10-CM | POA: Insufficient documentation

## 2017-02-26 MED ORDER — PREDNISONE 20 MG PO TABS
40.0000 mg | ORAL_TABLET | Freq: Once | ORAL | Status: AC
  Administered 2017-02-26: 40 mg via ORAL
  Filled 2017-02-26: qty 2

## 2017-02-26 MED ORDER — FAMOTIDINE IN NACL 20-0.9 MG/50ML-% IV SOLN
20.0000 mg | INTRAVENOUS | Status: AC
  Administered 2017-02-26: 20 mg via INTRAVENOUS
  Filled 2017-02-26: qty 50

## 2017-02-26 MED ORDER — DIPHENHYDRAMINE HCL 50 MG/ML IJ SOLN
25.0000 mg | Freq: Once | INTRAMUSCULAR | Status: AC
  Administered 2017-02-26: 25 mg via INTRAVENOUS
  Filled 2017-02-26: qty 1

## 2017-02-26 MED ORDER — PREDNISONE 20 MG PO TABS: 40.0000 mg | ORAL_TABLET | Freq: Every day | ORAL | 0 refills | Status: DC

## 2017-02-26 NOTE — ED Provider Notes (Signed)
[facility] DEPT Provider Note   CSN: [id number] Arrival date & time: 02/26/17  5859     History   Chief Complaint Chief Complaint  Patient presents with  . Allergic Reaction    HPI Suzanne Short is a 21 y.o. female.  HPI  21 y/o female  States she has had allergic reactions since 5th grade - takes daily benadryl and pepcid to "keep it in my system" - states that she missed her dose this morning and started to have itching on her face and hands at work after drinking her morning coffee - she started to have swellign of her lower lip - no difficulty breathing / wheezing or any other c/o including:  No fevers, chills, headache, sore throat, visual changes, neck pain, back pain, chest pain, abdominal pain, shortness of breath, cough, dysuria, diarrhea, rectal bleeding, swelling, numbness or weakness.  She has never had allergy testing and states that it is because she doesn't have any insurance.  She has not had any meds today.  She takes no other medicines. Last on prednisone 1 week ago - after had 5 day course.   Past Medical History:  Diagnosis Date  . Abnormal Pap smear of cervix 12/15/2016   LSIL, get colpo  . Bladder infection, chronic   . Contraceptive management 07/14/2015  . GERD (gastroesophageal reflux disease)   . Hidradenitis   . 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 Hypertension   . Renal disorder    chronic bladder infections  . Supervision of normal pregnancy in first trimester 05/03/2013  . Swelling   . Vaginal Pap smear, abnormal     Patient Active Problem List   Diagnosis Date Noted  . Abnormal Pap smear of cervix 12/15/2016  . History of abnormal cervical Pap smear 12/13/2016  . Encounter for surveillance of injectable contraceptive 12/13/2016  . 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 Contraceptive management 07/14/2015  . Angioedema 07/07/2015  . GERD without esophagitis 07/07/2015  . Urticaria 07/07/2015  . Family planning 09/18/2014  . Hypoactive sexual  desire 08/06/2014  . Mood changes 08/06/2014  . Hidradenitis 04/23/2014  . Intertrigo labialis 10/30/2013  . Severe obesity (BMI >= 40) ([place]) 06/26/2013  . Marijuana use 06/24/2013    Past Surgical History:  Procedure Laterality Date  . TONSILLECTOMY      OB History    Gravida Para Term Preterm AB Living   _0 0 0 1   SAB TAB Ectopic Multiple Live Births   0 0 0 0 1       Home Medications    Prior to Admission medications   Medication Sig Start Date End Date Taking? Authorizing Provider  blood glucose meter kit and supplies KIT Dispense based on patient and insurance preference. Use up to four times daily as directed. (FOR ICD-9 250.00, 250.01). 07/08/15  Yes Kathie Dike, MD  diphenhydrAMINE (BENADRYL) 25 mg capsule Take 2 capsules (50 mg total) by mouth every 6 (six) hours as needed. Allergies 07/08/15  Yes Kathie Dike, MD  EPINEPHrine (EPI-PEN) 0.3 mg/0.3 mL DEVI Inject 0.3 mLs (0.3 mg total) into the muscle once. 07/08/15  Yes Kathie Dike, MD  famotidine (PEPCID) 20 MG tablet Take 1 tablet (20 mg total) by mouth 2 (two) times daily. 07/08/15  Yes Kathie Dike, MD  medroxyPROGESTERone (DEPO-PROVERA) 150 MG/ML injection Inject 1 mL (150 mg total) into the muscle every 3 (three) months. 12/13/16  Yes Estill Dooms, NP  predniSONE (DELTASONE) 20 MG tablet Take 2 tablets (40 mg  total) by mouth daily. 02/26/17   Noemi Chapel, MD  silver sulfADIAZINE (SILVADENE) 1 % cream Use to area 3-4 times per day Patient not taking: Reported on 01/06/2017 01/03/17   Florian Buff, MD    Family History Family History  Problem Relation Age of Onset  . Diabetes Paternal Grandfather   . Heart disease Paternal Grandfather   . Diabetes Paternal Grandmother   . Heart disease Paternal Grandmother   . Diabetes Maternal Grandmother   . Heart disease Maternal Grandmother   . Diabetes Maternal Grandfather   . Heart disease Maternal Grandfather   . Heart disease Mother      Social History Social History  Substance Use Topics  . Smoking status: Current Every Day Smoker    Packs/day: 1.50    Years: 2.00    Types: Cigarettes  . Smokeless tobacco: Former Systems developer    Types: Chew  . Alcohol use 0.0 oz/week     Comment: occ     Allergies   Nsaids; Aleve [naproxen sodium]; Amoxicillin; and Tylenol [acetaminophen]   Review of Systems Review of Systems  All other systems reviewed and are negative.    Physical Exam Updated Vital Signs BP 131/70 (BP Location: Left Arm)   Pulse 87   Temp 98.2 F (36.8 C) (Oral)   Resp 18   Ht '5\' 8"'$  (1.727 m)   Wt 131.5 kg (290 lb)   SpO2 98%   BMI 44.09 kg/m   Physical Exam  Constitutional: She appears well-developed and well-nourished. No distress.  HENT:  Head: Normocephalic and atraumatic.  Mouth/Throat: Oropharynx is clear and moist. No oropharyngeal exudate.  Mild swelling of the left lower lip, no facial rash otherwise  Eyes: Pupils are equal, round, and reactive to light. Conjunctivae and EOM are normal. Right eye exhibits no discharge. Left eye exhibits no discharge. No scleral icterus.  Neck: Normal range of motion. Neck supple. No JVD present. No thyromegaly present.  Cardiovascular: Normal rate, regular rhythm, normal heart sounds and intact distal pulses.  Exam reveals no gallop and no friction rub.   No murmur heard. Pulmonary/Chest: Effort normal and breath sounds normal. No respiratory distress. She has no wheezes. She has no rales.  Respirations are clear, phonation is normal  Abdominal: Soft. Bowel sounds are normal. She exhibits no distension and no mass. There is no tenderness.  Musculoskeletal: Normal range of motion. She exhibits no edema or tenderness.  Lymphadenopathy:    She has no cervical adenopathy.  Neurological: She is alert. Coordination normal.  Skin: Skin is warm and dry. No rash noted. No erythema.  No rash to the extremities including the hands, no obvious swelling of the  hands  Psychiatric: She has a normal mood and affect. Her behavior is normal.  Nursing note and vitals reviewed.    ED Treatments / Results  Labs (all labs ordered are listed, but only abnormal results are displayed) Labs Reviewed - No data to display   Radiology No results found.  Procedures Procedures (including critical care time)  Medications Ordered in ED Medications  predniSONE (DELTASONE) tablet 40 mg (40 mg Oral Given 02/26/17 0921)  famotidine (PEPCID) IVPB 20 mg premix (0 mg Intravenous Stopped 02/26/17 0953)  diphenhydrAMINE (BENADRYL) injection 25 mg (25 mg Intravenous Given 02/26/17 0920)     Initial Impression / Assessment and Plan / ED Course  I have reviewed the triage vital signs and the nursing notes.  Pertinent labs & imaging results that were available during my care  of the patient were reviewed by me and considered in my medical decision making (see chart for details).      Overall the patient appears to have had an ongoing allergy to something. She absolutely needs to have allergy testing, I have made this extremely clear and have not mixed my words with her. She could have a life-threatening allergic reaction if she does not figure out what she is ultimately allergic to. That being said she has been having this for many years. She does not appear to have life-threatening symptoms at this time. She'll be given dose of oral prednisone, antihistamines and a short observatory period to make sure she doesn't worsen  Ijmproved with m eds, swelling has gone  Now Has no resp symptoms Encouraged allergy testing - pt expressed understanding  Final Clinical Impressions(s) / ED Diagnoses   Final diagnoses:  Allergic reaction, initial encounter    New Prescriptions New Prescriptions   PREDNISONE (DELTASONE) 20 MG TABLET    Take 2 tablets (40 mg total) by mouth daily.     Noemi Chapel, MD 02/26/17 1155

## 2017-02-26 NOTE — Discharge Instructions (Signed)
Pepcid twice daily for a week Prednisone daily for 5 days Benadryl in the morning as as needed every 6 hours.

## 2017-02-26 NOTE — ED Triage Notes (Signed)
Pt reports lip swelling and itching starting about 30 mins pta.  Usually takes benadryl and pepcid daily, but did not today.

## 2017-03-13 ENCOUNTER — Other Ambulatory Visit: Payer: Self-pay | Admitting: Adult Health

## 2017-03-14 ENCOUNTER — Ambulatory Visit (INDEPENDENT_AMBULATORY_CARE_PROVIDER_SITE_OTHER): Payer: Medicaid Other

## 2017-03-14 ENCOUNTER — Ambulatory Visit: Payer: Medicaid Other

## 2017-03-14 VITALS — Wt 297.4 lb

## 2017-03-14 DIAGNOSIS — Z3049 Encounter for surveillance of other contraceptives: Secondary | ICD-10-CM

## 2017-03-14 DIAGNOSIS — Z3202 Encounter for pregnancy test, result negative: Secondary | ICD-10-CM

## 2017-03-14 DIAGNOSIS — Z3042 Encounter for surveillance of injectable contraceptive: Secondary | ICD-10-CM

## 2017-03-14 LAB — POCT URINE PREGNANCY: Preg Test, Ur: NEGATIVE

## 2017-03-14 MED ORDER — MEDROXYPROGESTERONE ACETATE 150 MG/ML IM SUSP
150.0000 mg | Freq: Once | INTRAMUSCULAR | Status: AC
  Administered 2017-03-14: 150 mg via INTRAMUSCULAR

## 2017-03-14 NOTE — Addendum Note (Signed)
Addended by: Federico FlakeNES, PEGGY A on: 03/14/2017 10:58 AM   Modules accepted: Level of Service

## 2017-03-14 NOTE — Progress Notes (Signed)
PT here for depo shot 150 mg IM given rt deltoid. Tolerated well. Return 12 week for next shot. Pad CMA 

## 2017-04-07 ENCOUNTER — Encounter (HOSPITAL_COMMUNITY): Payer: Self-pay | Admitting: Emergency Medicine

## 2017-04-07 ENCOUNTER — Emergency Department (HOSPITAL_COMMUNITY)
Admission: EM | Admit: 2017-04-07 | Discharge: 2017-04-07 | Disposition: A | Discharge status: Home / Self Care | Payer: Self-pay | Attending: Emergency Medicine | Admitting: Emergency Medicine

## 2017-04-07 DIAGNOSIS — F17210 Nicotine dependence, cigarettes, uncomplicated: Secondary | ICD-10-CM | POA: Insufficient documentation

## 2017-04-07 DIAGNOSIS — L03316 Cellulitis of umbilicus: Secondary | ICD-10-CM | POA: Insufficient documentation

## 2017-04-07 DIAGNOSIS — Z79899 Other long term (current) drug therapy: Secondary | ICD-10-CM | POA: Insufficient documentation

## 2017-04-07 DIAGNOSIS — I10 Essential (primary) hypertension: Secondary | ICD-10-CM | POA: Insufficient documentation

## 2017-04-07 LAB — POC URINE PREG, ED: PREG TEST UR: NEGATIVE

## 2017-04-07 MED ORDER — TRAMADOL HCL 50 MG PO TABS: 50.0000 mg | ORAL_TABLET | Freq: Four times a day (QID) | ORAL | 0 refills | Status: DC | PRN

## 2017-04-07 MED ORDER — SULFAMETHOXAZOLE-TRIMETHOPRIM 800-160 MG PO TABS: 1.0000 | ORAL_TABLET | Freq: Two times a day (BID) | ORAL | 0 refills | Status: AC

## 2017-04-07 MED ORDER — SULFAMETHOXAZOLE-TRIMETHOPRIM 800-160 MG PO TABS
1.0000 | ORAL_TABLET | Freq: Once | ORAL | Status: AC
  Administered 2017-04-07: 1 via ORAL
  Filled 2017-04-07: qty 1

## 2017-04-07 MED ORDER — TRAMADOL HCL 50 MG PO TABS
50.0000 mg | ORAL_TABLET | Freq: Once | ORAL | Status: AC
  Administered 2017-04-07: 50 mg via ORAL
  Filled 2017-04-07: qty 1

## 2017-04-07 NOTE — ED Triage Notes (Signed)
Patient complaining of redness and "knot" around umbilical area x 3 days.

## 2017-04-07 NOTE — ED Provider Notes (Signed)
[facility] EMERGENCY DEPARTMENT Provider Note   CSN: [id number] Arrival date & time: 04/07/17  0935     History   Chief Complaint Chief Complaint  Patient presents with  . Abscess    HPI Suzanne Short is a 21 y.o. female presented with a 3 day history of periumbilical tenderness and erythema.  She describes having a piercing at this location last summer which she maintained for 2 days, developing pain and redness at the site so she removed the piercing.  She has had no problems at the site until yesterday when she noticed a tender nodule at the piercing site and now awoke today with spreading redness.  She describes pain which is worsened with movement and palpation.  She has had no nausea or vomiting, no fevers or chills.  She has had no medications for her symptoms prior to arrival.  There is been no drainage from the site.  The history is provided by the patient and a parent.    Past Medical History:  Diagnosis Date  . Abnormal Pap smear of cervix 12/15/2016   LSIL, get colpo  . Bladder infection, chronic   . Contraceptive management 07/14/2015  . GERD (gastroesophageal reflux disease)   . Hidradenitis   . 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 Hypertension   . Renal disorder    chronic bladder infections  . Supervision of normal pregnancy in first trimester 05/03/2013  . Swelling   . Vaginal Pap smear, abnormal     Patient Active Problem List   Diagnosis Date Noted  . Abnormal Pap smear of cervix 12/15/2016  . History of abnormal cervical Pap smear 12/13/2016  . Encounter for surveillance of injectable contraceptive 12/13/2016  . 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 Contraceptive management 07/14/2015  . Angioedema 07/07/2015  . GERD without esophagitis 07/07/2015  . Urticaria 07/07/2015  . Family planning 09/18/2014  . Hypoactive sexual desire 08/06/2014  . Mood changes 08/06/2014  . Hidradenitis 04/23/2014  . Intertrigo labialis 10/30/2013  . Severe obesity (BMI >= 40)  ([place]) 06/26/2013  . Marijuana use 06/24/2013    Past Surgical History:  Procedure Laterality Date  . TONSILLECTOMY      OB History    Gravida Para Term Preterm AB Living   _0 0 0 1   SAB TAB Ectopic Multiple Live Births   0 0 0 0 1       Home Medications    Prior to Admission medications   Medication Sig Start Date End Date Taking? Authorizing Provider  blood glucose meter kit and supplies KIT Dispense based on patient and insurance preference. Use up to four times daily as directed. (FOR ICD-9 250.00, 250.01). 07/08/15   Kathie Dike, MD  diphenhydrAMINE (BENADRYL) 25 mg capsule Take 2 capsules (50 mg total) by mouth every 6 (six) hours as needed. Allergies 07/08/15   Kathie Dike, MD  EPINEPHrine (EPI-PEN) 0.3 mg/0.3 mL DEVI Inject 0.3 mLs (0.3 mg total) into the muscle once. Patient not taking: Reported on 03/14/2017 07/08/15   Kathie Dike, MD  famotidine (PEPCID) 20 MG tablet Take 1 tablet (20 mg total) by mouth 2 (two) times daily. 07/08/15   Kathie Dike, MD  medroxyPROGESTERone (DEPO-PROVERA) 150 MG/ML injection INJECT 1 ML (150 MG TOTAL) INTO THE MUSCLE EVERY 3 (THREE) MONTHS. 03/13/17   Estill Dooms, NP  predniSONE (DELTASONE) 20 MG tablet Take 2 tablets (40 mg total) by mouth daily. Patient not taking: Reported on 03/14/2017 02/26/17   Noemi Chapel, MD  silver sulfADIAZINE (SILVADENE) 1 % cream Use to area 3-4 times per day 01/03/17   Florian Buff, MD  sulfamethoxazole-trimethoprim (BACTRIM DS,SEPTRA DS) 800-160 MG tablet Take 1 tablet by mouth 2 (two) times daily for 10 days. 04/07/17 04/17/17  Evalee Jefferson, PA-C  traMADol (ULTRAM) 50 MG tablet Take 1 tablet (50 mg total) by mouth every 6 (six) hours as needed. 04/07/17   Evalee Jefferson, PA-C    Family History Family History  Problem Relation Age of Onset  . Diabetes Paternal Grandfather   . Heart disease Paternal Grandfather   . Diabetes Paternal Grandmother   . Heart disease Paternal Grandmother     . Diabetes Maternal Grandmother   . Heart disease Maternal Grandmother   . Diabetes Maternal Grandfather   . Heart disease Maternal Grandfather   . Heart disease Mother     Social History Social History   Tobacco Use  . Smoking status: Current Every Day Smoker    Packs/day: 1.50    Years: 2.00    Pack years: 3.00    Types: Cigarettes  . Smokeless tobacco: Former Systems developer    Types: Chew  Substance Use Topics  . Alcohol use: Yes    Alcohol/week: 0.0 oz    Comment: occ  . Drug use: No     Allergies   Nsaids; Aleve [naproxen sodium]; Amoxicillin; and Tylenol [acetaminophen]   Review of Systems Review of Systems  Constitutional: Negative for chills and fever.  Respiratory: Negative for shortness of breath and wheezing.   Gastrointestinal: Negative for nausea and vomiting.  Skin: Positive for color change. Negative for wound.  Neurological: Negative for numbness.     Physical Exam Updated Vital Signs BP 128/78 (BP Location: Right Arm)   Pulse 69   Temp 98.2 F (36.8 C) (Oral)   Resp 16   Ht _0  (1.727 m)   Wt 127 kg (280 lb)   SpO2 96%   BMI 42.57 kg/m   Physical Exam  Constitutional: She appears well-developed and well-nourished. No distress.  HENT:  Head: Normocephalic.  Neck: Neck supple.  Cardiovascular: Normal rate.  Pulmonary/Chest: Effort normal. She has no wheezes.  Abdominal: There is tenderness in the periumbilical area.  Musculoskeletal: Normal range of motion. She exhibits no edema.  Skin: There is erythema.  Patient has an 11 cm greatest diameter area of erythema which is clearly demarcated at her umbilicus.  There is trace induration at the upper edge of the umbilicus consistent with the location of her prior piercing.  There is no fluctuance, no drainage.  Abdomen is otherwise soft and nontender.     ED Treatments / Results  Labs (all labs ordered are listed, but only abnormal results are displayed) Labs Reviewed  POC URINE PREG, ED     EKG  EKG Interpretation None       Radiology No results found.  Procedures Procedures (including critical care time)  Medications Ordered in ED Medications  sulfamethoxazole-trimethoprim (BACTRIM DS,SEPTRA DS) 800-160 MG per tablet 1 tablet (1 tablet Oral Given 04/07/17 1050)  traMADol (ULTRAM) tablet 50 mg (50 mg Oral Given 04/07/17 1050)     Initial Impression / Assessment and Plan / ED Course  I have reviewed the triage vital signs and the nursing notes.  Pertinent labs & imaging results that were available during my care of the patient were reviewed by me and considered in my medical decision making (see chart for details).     Periumbilical cellulitis without abscess at this  time.  Patient was placed on Bactrim, discussed warm compresses.  Also discussed close follow-up for any worsening symptoms.  The area was marked with a skin marker pen so she can closely watch this infection.  She was advised to return for recheck tomorrow if there is significant worsening of her symptoms or spreading of the erythema beyond the current site.  Final Clinical Impressions(s) / ED Diagnoses   Final diagnoses:  Cellulitis of umbilicus    ED Discharge Orders        Ordered    sulfamethoxazole-trimethoprim (BACTRIM DS,SEPTRA DS) 800-160 MG tablet  2 times daily     04/07/17 1119    traMADol (ULTRAM) 50 MG tablet  Every 6 hours PRN     04/07/17 1119       Evalee Jefferson, PA-C 04/07/17 1122    Forde Dandy, MD 04/08/17 [phone]

## 2017-04-07 NOTE — Discharge Instructions (Signed)
Take your next dose of the antibiotic this evening.  You may take tramadol for pain relief, this will make you drowsy so do not drive within 4 hours of taking this medication.  Apply warm moist compresses to the site for 10 minutes 3-4 times daily which may help relieve this infection sooner.

## 2017-06-04 ENCOUNTER — Emergency Department (HOSPITAL_COMMUNITY)
Admission: EM | Admit: 2017-06-04 | Discharge: 2017-06-04 | Disposition: A | Discharge status: Home / Self Care | Payer: Self-pay | Attending: Emergency Medicine | Admitting: Emergency Medicine

## 2017-06-04 ENCOUNTER — Encounter (HOSPITAL_COMMUNITY): Payer: Self-pay | Admitting: Emergency Medicine

## 2017-06-04 ENCOUNTER — Other Ambulatory Visit: Payer: Self-pay

## 2017-06-04 DIAGNOSIS — Z5321 Procedure and treatment not carried out due to patient leaving prior to being seen by health care provider: Secondary | ICD-10-CM | POA: Insufficient documentation

## 2017-06-04 DIAGNOSIS — H5712 Ocular pain, left eye: Secondary | ICD-10-CM | POA: Insufficient documentation

## 2017-06-04 NOTE — ED Notes (Signed)
Pt not in room.

## 2017-06-04 NOTE — ED Notes (Signed)
Not in room

## 2017-06-04 NOTE — ED Triage Notes (Signed)
Patient c/o left eye pain that started while at work. Denies any injury and unsure if anything got in eye. Per patient itching and burning. Denies any drainage.

## 2017-06-06 ENCOUNTER — Encounter: Payer: Self-pay | Admitting: *Deleted

## 2017-06-06 ENCOUNTER — Ambulatory Visit (INDEPENDENT_AMBULATORY_CARE_PROVIDER_SITE_OTHER): Payer: Medicaid Other | Admitting: *Deleted

## 2017-06-06 DIAGNOSIS — Z3042 Encounter for surveillance of injectable contraceptive: Secondary | ICD-10-CM

## 2017-06-06 DIAGNOSIS — Z3202 Encounter for pregnancy test, result negative: Secondary | ICD-10-CM

## 2017-06-06 LAB — POCT URINE PREGNANCY: Preg Test, Ur: NEGATIVE

## 2017-06-06 MED ORDER — MEDROXYPROGESTERONE ACETATE 150 MG/ML IM SUSP
150.0000 mg | Freq: Once | INTRAMUSCULAR | Status: AC
  Administered 2017-06-06: 150 mg via INTRAMUSCULAR

## 2017-06-06 NOTE — Progress Notes (Signed)
Depo Provera 150 mg given IM in left deltoid. Patient tolerated well, to return in 12 weeks.

## 2017-06-13 ENCOUNTER — Encounter (HOSPITAL_COMMUNITY): Payer: Self-pay | Admitting: Emergency Medicine

## 2017-06-13 ENCOUNTER — Emergency Department (HOSPITAL_COMMUNITY)
Admission: EM | Admit: 2017-06-13 | Discharge: 2017-06-13 | Disposition: A | Discharge status: Home / Self Care | Payer: Self-pay | Attending: Emergency Medicine | Admitting: Emergency Medicine

## 2017-06-13 ENCOUNTER — Other Ambulatory Visit: Payer: Self-pay

## 2017-06-13 DIAGNOSIS — R220 Localized swelling, mass and lump, head: Secondary | ICD-10-CM

## 2017-06-13 DIAGNOSIS — F17210 Nicotine dependence, cigarettes, uncomplicated: Secondary | ICD-10-CM | POA: Insufficient documentation

## 2017-06-13 DIAGNOSIS — R600 Localized edema: Secondary | ICD-10-CM | POA: Insufficient documentation

## 2017-06-13 DIAGNOSIS — T7840XA Allergy, unspecified, initial encounter: Secondary | ICD-10-CM | POA: Insufficient documentation

## 2017-06-13 MED ORDER — PREDNISONE 20 MG PO TABS: 40.0000 mg | ORAL_TABLET | Freq: Every day | ORAL | 0 refills | Status: AC

## 2017-06-13 MED ORDER — PREDNISONE 50 MG PO TABS
60.0000 mg | ORAL_TABLET | Freq: Once | ORAL | Status: AC
  Administered 2017-06-13: 60 mg via ORAL
  Filled 2017-06-13: qty 1

## 2017-06-13 NOTE — ED Triage Notes (Signed)
Pt states waking up at 1 am this am with lower lip and left eye swelling. Pt took two 25 mg benadryl's at home with no relief. No new allergies noted.

## 2017-06-13 NOTE — Discharge Instructions (Signed)

## 2017-06-13 NOTE — ED Provider Notes (Signed)
Emergency Department Provider Note   I have reviewed the triage vital signs and the nursing notes.   HISTORY  Chief Complaint Facial Swelling   HPI Suzanne Short is a 22 y.o. female with PMH of intermittent face swelling, GERD, hidradenitis, and HTN's to the emergency department for evaluation of face swelling that started this morning at 1 AM.  Patient woke from sleep and felt that her bottom lip and left eye were swollen.  She is feeling itchy and tingly.  Denies rash.  Denies any known exposure to allergen.  She has had this frequently in the past with exposures to multiple potential allergens and also reports similar reaction to stress.  She was encouraged to see an allergist but does not have insurance and so has been unable to do so.  She does carry an EpiPen with her but do not feel the reaction today warranted EpiPen administration.  She took Benadryl initially and then waited 6 hours to take additional Benadryl and Pepcid and she feels that her swelling is about the same.  He denies any difficulty breathing or swallowing.  No sudden worsening of symptoms.  She is not on an ACE/ARB medication.    Past Medical History:  Diagnosis Date  . Abnormal Pap smear of cervix 12/15/2016   LSIL, get colpo  . Bladder infection, chronic   . Contraceptive management 07/14/2015  . GERD (gastroesophageal reflux disease)   . Hidradenitis   . 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 Hypertension   . Renal disorder    chronic bladder infections  . Supervision of normal pregnancy in first trimester 05/03/2013  . Swelling   . Vaginal Pap smear, abnormal     Patient Active Problem List   Diagnosis Date Noted  . Abnormal Pap smear of cervix 12/15/2016  . History of abnormal cervical Pap smear 12/13/2016  . Encounter for surveillance of injectable contraceptive 12/13/2016  . 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 Contraceptive management 07/14/2015  . Angioedema 07/07/2015  . GERD without esophagitis  07/07/2015  . Urticaria 07/07/2015  . Family planning 09/18/2014  . Hypoactive sexual desire 08/06/2014  . Mood changes 08/06/2014  . Hidradenitis 04/23/2014  . Intertrigo labialis 10/30/2013  . Severe obesity (BMI >= 40) (HCC) 06/26/2013  . Marijuana use 06/24/2013    Past Surgical History:  Procedure Laterality Date  . TONSILLECTOMY      Current Outpatient Rx  . Order #: [id number] Class: Print  . Order #: [id number] Class: Print  . Order #: [id number] Class: Print  . Order #: [id number] Class: Print  . Order #: [id number] Class: Normal  . [START ON 06/14/2017] Order #: [id number] Class: Print  . Order #: [id number] Class: Normal  . Order #: [id number] Class: Print    Allergies Nsaids; Aleve [naproxen sodium]; Amoxicillin; and Tylenol [acetaminophen]  Family History  Problem Relation Age of Onset  . Diabetes Paternal Grandfather   . Heart disease Paternal Grandfather   . Diabetes Paternal Grandmother   . Heart disease Paternal Grandmother   . Diabetes Maternal Grandmother   . Heart disease Maternal Grandmother   . Diabetes Maternal Grandfather   . Heart disease Maternal Grandfather   . Heart disease Mother     Social History Social History   Tobacco Use  . Smoking status: Current Every Day Smoker    Packs/day: 1.50    Years: 2.00    Pack years: 3.00    Types: Cigarettes  . Smokeless tobacco: Former NeurosurgeonUser    Types: Chew  Substance Use Topics  . Alcohol use:  Yes    Alcohol/week: 0.0 oz    Comment: occ  . Drug use: No    Review of Systems  Constitutional: No fever/chills Eyes: No visual changes. ENT: No sore throat. Positive left eye and lower lip swelling.  Cardiovascular: Denies chest pain. Respiratory: Denies shortness of breath. Gastrointestinal: No abdominal pain.  No nausea, no vomiting.  No diarrhea.  No constipation. Genitourinary: Negative for dysuria. Musculoskeletal: Negative for back pain. Skin: Negative for rash. Neurological: Negative for headaches,  focal weakness or numbness.  10-point ROS otherwise negative.  ____________________________________________   PHYSICAL EXAM:  VITAL SIGNS: ED Triage Vitals  Enc Vitals Group     BP 06/13/17 0702 (!) 119/44     Pulse Rate 06/13/17 0702 89     Resp 06/13/17 0702 18     Temp 06/13/17 0702 97.6 F (36.4 C)     Temp Source 06/13/17 0702 Oral     SpO2 06/13/17 0702 98 %     Weight 06/13/17 0703 280 lb (127 kg)     Height 06/13/17 0703 5\' 10"  (1.778 m)     Pain Score 06/13/17 0702 0   Constitutional: Alert and oriented. Well appearing and in no acute distress. Eyes: Conjunctivae are normal. Mild left periorbital edema without erythema or warmth.  Head: Atraumatic. Nose: No congestion/rhinnorhea. Mouth/Throat: Mucous membranes are moist.  Oropharynx non-erythematous. Oropharynx is widely patent. Managing oral secretions and speaking in a clear voice. Very mild lower lip swelling.  Neck: No stridor.   Cardiovascular: Normal rate, regular rhythm. Good peripheral circulation. Grossly normal heart sounds.   Respiratory: Normal respiratory effort.  No retractions. Lungs CTAB. Gastrointestinal: Soft and nontender. No distention.  Musculoskeletal: No lower extremity tenderness nor edema. No gross deformities of extremities. Neurologic:  Normal speech and language. No gross focal neurologic deficits are appreciated.  Skin:  Skin is warm, dry and intact. No rash noted.  ____________________________________________  RADIOLOGY  None ____________________________________________   PROCEDURES  Procedure(s) performed:   Procedures  None ____________________________________________   INITIAL IMPRESSION / ASSESSMENT AND PLAN / ED COURSE  Pertinent labs & imaging results that were available during my care of the patient were reviewed by me and considered in my medical decision making (see chart for details).  Patient presents to the emergency department with very mild swelling of the  left eye and lower lip starting at 1 AM this morning.  She is taken 2 doses of Benadryl and some Pepcid at home.  She is very well-appearing.  Swelling is most prominent around the left eye barely perceptible over the lower lip.  The posterior pharynx is widely patent with no tongue swelling.  Patient not having any signs or symptoms to suggest deeper airway edema or involvement.  No wheezing on exam.  No indication for EpiPen at this time.  Patient has had this happen multiple times before.  Plan for continued Benadryl and Pepcid at home.  Will treat with steroid here and discharged home with 5 day burst.  Provided contact information for a local allergist.  She is going to present to the health department to try and secure health insurance.  At this time, I do not feel there is any life-threatening condition present. I have reviewed and discussed all results (EKG, imaging, lab, urine as appropriate), exam findings with patient. I have reviewed nursing notes and appropriate previous records.  I feel the patient is safe to be discharged home without further emergent workup. Discussed usual and customary return precautions. Patient  and family (if present) verbalize understanding and are comfortable with this plan.  Patient will follow-up with their primary care provider. If they do not have a primary care provider, information for follow-up has been provided to them. All questions have been answered.   ____________________________________________  FINAL CLINICAL IMPRESSION(S) / ED DIAGNOSES  Final diagnoses:  Allergic reaction, initial encounter  Facial swelling     MEDICATIONS GIVEN DURING THIS VISIT:  Medications  predniSONE (DELTASONE) tablet 60 mg (not administered)     NEW OUTPATIENT MEDICATIONS STARTED DURING THIS VISIT:  New Prescriptions   PREDNISONE (DELTASONE) 20 MG TABLET    Take 2 tablets (40 mg total) by mouth daily for 4 days.    Note:  This document was prepared using Dragon  voice recognition software and may include unintentional dictation errors.  Alona Bene, MD Emergency Medicine Jacqulyn Bath, Arlyss Repress, MD 06/13/17 [phone]

## 2017-07-20 ENCOUNTER — Encounter (HOSPITAL_COMMUNITY): Payer: Self-pay | Admitting: Emergency Medicine

## 2017-07-20 ENCOUNTER — Other Ambulatory Visit: Payer: Self-pay

## 2017-07-20 ENCOUNTER — Emergency Department (HOSPITAL_COMMUNITY)
Admission: EM | Admit: 2017-07-20 | Discharge: 2017-07-20 | Disposition: A | Discharge status: Home / Self Care | Payer: Self-pay | Attending: Emergency Medicine | Admitting: Emergency Medicine

## 2017-07-20 DIAGNOSIS — T7840XA Allergy, unspecified, initial encounter: Secondary | ICD-10-CM | POA: Insufficient documentation

## 2017-07-20 DIAGNOSIS — F17210 Nicotine dependence, cigarettes, uncomplicated: Secondary | ICD-10-CM | POA: Insufficient documentation

## 2017-07-20 DIAGNOSIS — I10 Essential (primary) hypertension: Secondary | ICD-10-CM | POA: Insufficient documentation

## 2017-07-20 MED ORDER — FAMOTIDINE IN NACL 20-0.9 MG/50ML-% IV SOLN
20.0000 mg | Freq: Once | INTRAVENOUS | Status: AC
  Administered 2017-07-20: 20 mg via INTRAVENOUS
  Filled 2017-07-20: qty 50

## 2017-07-20 MED ORDER — DIPHENHYDRAMINE HCL 50 MG/ML IJ SOLN
25.0000 mg | Freq: Once | INTRAMUSCULAR | Status: AC
  Administered 2017-07-20: 25 mg via INTRAVENOUS
  Filled 2017-07-20: qty 1

## 2017-07-20 MED ORDER — PREDNISONE 20 MG PO TABS: 40.0000 mg | ORAL_TABLET | Freq: Every day | ORAL | 0 refills | Status: DC

## 2017-07-20 MED ORDER — SODIUM CHLORIDE 0.9 % IV BOLUS (SEPSIS)
500.0000 mL | Freq: Once | INTRAVENOUS | Status: AC
  Administered 2017-07-20: 500 mL via INTRAVENOUS

## 2017-07-20 MED ORDER — METHYLPREDNISOLONE SODIUM SUCC 125 MG IJ SOLR
125.0000 mg | Freq: Once | INTRAMUSCULAR | Status: AC
  Administered 2017-07-20: 125 mg via INTRAVENOUS
  Filled 2017-07-20: qty 2

## 2017-07-20 NOTE — ED Provider Notes (Signed)
[facility] EMERGENCY DEPARTMENT Provider Note   CSN: [id number] Arrival date & time: 07/20/17  1134     History   Chief Complaint Chief Complaint  Patient presents with  . Allergic Reaction    HPI Suzanne Short is a 22 y.o. female.  HPI Presents with concern of erythematous changes on both arms. Patient has history of frequent cutaneous eruptions, attributed to allergic reaction. She notes that since she was a child she has had these episodes. She is currently using Pepcid, Benadryl, 4 times daily, notes that she had been doing generally well until about 3 hours ago. About the time she noticed slightly raised, erythematous edges on both arms, no lip swelling, no difficulty breathing, speaking, swallowing. No additional medication taken for relief, and symptoms been progressive since onset. No fever, no confusion disorientation, chest pain, dyspnea. Patient notes that she has no access to specialty care, but has been referred to allergist multiple times.   Past Medical History:  Diagnosis Date  . Abnormal Pap smear of cervix 12/15/2016   LSIL, get colpo  . Bladder infection, chronic   . Contraceptive management 07/14/2015  . GERD (gastroesophageal reflux disease)   . Hidradenitis   . 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 Hypertension   . Renal disorder    chronic bladder infections  . Supervision of normal pregnancy in first trimester 05/03/2013  . Swelling   . Vaginal Pap smear, abnormal     Patient Active Problem List   Diagnosis Date Noted  . Abnormal Pap smear of cervix 12/15/2016  . History of abnormal cervical Pap smear 12/13/2016  . Encounter for surveillance of injectable contraceptive 12/13/2016  . 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 Contraceptive management 07/14/2015  . Angioedema 07/07/2015  . GERD without esophagitis 07/07/2015  . Urticaria 07/07/2015  . Family planning 09/18/2014  . Hypoactive sexual desire 08/06/2014  . Mood changes 08/06/2014  .  Hidradenitis 04/23/2014  . Intertrigo labialis 10/30/2013  . Severe obesity (BMI >= 40) (HCC) 06/26/2013  . Marijuana use 06/24/2013    Past Surgical History:  Procedure Laterality Date  . TONSILLECTOMY      OB History    Gravida Para Term Preterm AB Living   1 1 1  0 0 1   SAB TAB Ectopic Multiple Live Births   0 0 0 0 1       Home Medications    Prior to Admission medications   Medication Sig Start Date End Date Taking? Authorizing Provider  diphenhydrAMINE (BENADRYL) 25 mg capsule Take 2 capsules (50 mg total) by mouth every 6 (six) hours as needed. Allergies 07/08/15  Yes Erick Blinks, MD  EPINEPHrine (EPI-PEN) 0.3 mg/0.3 mL DEVI Inject 0.3 mLs (0.3 mg total) into the muscle once. 07/08/15  Yes Erick Blinks, MD  famotidine (PEPCID) 20 MG tablet Take 1 tablet (20 mg total) by mouth 2 (two) times daily. 07/08/15  Yes Erick Blinks, MD  medroxyPROGESTERone (DEPO-PROVERA) 150 MG/ML injection INJECT 1 ML (150 MG TOTAL) INTO THE MUSCLE EVERY 3 (THREE) MONTHS. 03/13/17  Yes Adline Potter, NP  silver sulfADIAZINE (SILVADENE) 1 % cream Use to area 3-4 times per day 01/03/17  Yes Lazaro Arms, MD    Family History Family History  Problem Relation Age of Onset  . Diabetes Paternal Grandfather   . Heart disease Paternal Grandfather   . Diabetes Paternal Grandmother   . Heart disease Paternal Grandmother   . Diabetes Maternal Grandmother   . Heart disease Maternal Grandmother   .  Diabetes Maternal Grandfather   . Heart disease Maternal Grandfather   . Heart disease Mother     Social History Social History   Tobacco Use  . Smoking status: Current Every Day Smoker    Packs/day: 1.50    Years: 2.00    Pack years: 3.00    Types: Cigarettes  . Smokeless tobacco: Former NeurosurgeonUser    Types: Chew  Substance Use Topics  . Alcohol use: Yes    Alcohol/week: 0.0 oz    Comment: occ  . Drug use: No     Allergies   Nsaids; Aleve [naproxen sodium]; Amoxicillin; and  Tylenol [acetaminophen]   Review of Systems Review of Systems  Constitutional:       Per HPI, otherwise negative  HENT:       Per HPI, otherwise negative  Respiratory:       Per HPI, otherwise negative  Cardiovascular:       Per HPI, otherwise negative  Gastrointestinal: Negative for vomiting.  Endocrine:       Negative aside from HPI  Genitourinary:       Neg aside from HPI   Musculoskeletal:       Per HPI, otherwise negative  Skin: Positive for rash.  Neurological: Negative for syncope.     Physical Exam Updated Vital Signs BP 137/69 (BP Location: Right Arm)   Pulse 78   Temp 98.4 F (36.9 C) (Oral)   Resp 18   Ht 5\' 9"  (1.753 m)   Wt 127 kg (280 lb)   SpO2 98%   BMI 41.35 kg/m   Physical Exam  Constitutional: She is oriented to person, place, and time. She appears well-developed and well-nourished. No distress.  HENT:  Head: Normocephalic and atraumatic.  Unremarkable oropharynx, no lip swelling  Eyes: Conjunctivae and EOM are normal.  Cardiovascular: Normal rate and regular rhythm.  Pulmonary/Chest: Effort normal and breath sounds normal. No stridor. No respiratory distress.  Abdominal: She exhibits no distension.  Musculoskeletal: She exhibits no edema.  Neurological: She is alert and oriented to person, place, and time. No cranial nerve deficit.  Skin: Skin is warm and dry.  Multiple tattoos.  On both forearms have slightly raised erythematous patches No facial erythema, obvious rash.  Psychiatric: She has a normal mood and affect.  Nursing note and vitals reviewed.    ED Treatments / Results  Labs (all labs ordered are listed, but only abnormal results are displayed) Labs Reviewed - No data to display  EKG  EKG Interpretation None       Radiology No results found.  Procedures Procedures (including critical care time)  Medications Ordered in ED Medications  methylPREDNISolone sodium succinate (SOLU-MEDROL) 125 mg/2 mL injection 125 mg  (125 mg Intravenous Given 07/20/17 1300)  famotidine (PEPCID) IVPB 20 mg premix (0 mg Intravenous Stopped 07/20/17 1331)  diphenhydrAMINE (BENADRYL) injection 25 mg (25 mg Intravenous Given 07/20/17 1300)  sodium chloride 0.9 % bolus 500 mL (500 mLs Intravenous New Bag/Given 07/20/17 1320)     Initial Impression / Assessment and Plan / ED Course  I have reviewed the triage vital signs and the nursing notes.  Pertinent labs & imaging results that were available during my care of the patient were reviewed by me and considered in my medical decision making (see chart for details).  Review notable for 6 prior ED visits in 6 months, for similar concerns.  2:17 PM Cutaneous changes have resolved, no development of new respiratory difficulty, nor other complaints. Given her  history of recurrent allergic reactions going back years, she and I discussed appropriate follow-up, including inquiring for immunology/allergy clinic at [facility] our local [facility] centers. Given her aforementioned improvement, no evidence for anaphylaxis, normal progression, the patient will be discharged, with ongoing steroids, antihistamines for the next days.   Final Clinical Impressions(s) / ED Diagnoses  Allergic reaction, initial encounter   Gerhard Munch, MD 07/20/17 1418

## 2017-07-20 NOTE — ED Triage Notes (Signed)
Patient c/o allergic reaction with itching and rash. Patient states started at 121:15 this morning, cause unknown. Patient has frequent allergic reactions and  is supposed to have allergy test but has not been able to. Denies taking any medication for the reaction. No difficulty breath, swallowing, or whelps. Denies swelling in tongue.

## 2017-07-20 NOTE — Discharge Instructions (Signed)
As discussed, with your recurrent episodes of allergic reaction, it is worth inquiring at 1 of our nearby academic medical centers about availability of immunology/immunology clinic openings.  Please continue taking your Benadryl and Pepcid, as well as your steroids for the next 4 days.  Return here for concerning changes in your condition.

## 2017-07-23 ENCOUNTER — Other Ambulatory Visit: Payer: Self-pay

## 2017-07-23 ENCOUNTER — Encounter (HOSPITAL_COMMUNITY): Payer: Self-pay | Admitting: Emergency Medicine

## 2017-07-23 ENCOUNTER — Emergency Department (HOSPITAL_COMMUNITY)
Admission: EM | Admit: 2017-07-23 | Discharge: 2017-07-23 | Disposition: A | Discharge status: Home / Self Care | Payer: Self-pay | Attending: Emergency Medicine | Admitting: Emergency Medicine

## 2017-07-23 DIAGNOSIS — Z79899 Other long term (current) drug therapy: Secondary | ICD-10-CM | POA: Insufficient documentation

## 2017-07-23 DIAGNOSIS — T7840XA Allergy, unspecified, initial encounter: Secondary | ICD-10-CM | POA: Insufficient documentation

## 2017-07-23 DIAGNOSIS — F17210 Nicotine dependence, cigarettes, uncomplicated: Secondary | ICD-10-CM | POA: Insufficient documentation

## 2017-07-23 DIAGNOSIS — I10 Essential (primary) hypertension: Secondary | ICD-10-CM | POA: Insufficient documentation

## 2017-07-23 HISTORY — DX: Cannabis use, unspecified, uncomplicated: F12.90

## 2017-07-23 HISTORY — DX: Allergy, unspecified, initial encounter: T78.40XA

## 2017-07-23 MED ORDER — PROMETHAZINE HCL 12.5 MG PO TABS
25.0000 mg | ORAL_TABLET | Freq: Once | ORAL | Status: AC
  Administered 2017-07-23: 25 mg via ORAL
  Filled 2017-07-23: qty 2

## 2017-07-23 MED ORDER — DEXAMETHASONE SODIUM PHOSPHATE 10 MG/ML IJ SOLN
10.0000 mg | Freq: Once | INTRAMUSCULAR | Status: AC
  Administered 2017-07-23: 10 mg via INTRAMUSCULAR
  Filled 2017-07-23: qty 1

## 2017-07-23 NOTE — ED Provider Notes (Signed)
[facility] EMERGENCY DEPARTMENT Provider Note   CSN: [id number] Arrival date & time: 07/23/17  0746     History   Chief Complaint Chief Complaint  Patient presents with  . Facial Swelling    HPI Suzanne Short is a 22 y.o. female.  Patient is a 22 year old female who presents to the emergency department with complaint of facial swelling.  The patient states that she woke up this morning with swelling around her eyes and swelling of her lips.  This occurred around 6:30 AM.  She took 2 Benadryl and Pepcid dose.  The patient noted improvement in the swelling around her eyes, but the lips were still swollen and still an issue.  There is no difficulty with breathing and no difficulty with swallowing.  No wheezing noted by the patient.  No hives or other rash appreciated.  The patient states she has been dealing with this issue since she was 22 years old.  She has been seen in the emergency department on multiple occasions.  She is actually had one admission to the hospital for this issue.  She is never had to be intubated.  She has not had allergy testing, in spite of the fact she has been advised on multiple occasions to be tested.  The patient states that she does not have insurance, and it is difficult to save up the extra money.  Patient denies any new medications, foods, no exposures to chemicals that she is aware of, and no recent or new environments.  She presents now for evaluation of this facial swelling.      Past Medical History:  Diagnosis Date  . Abnormal Pap smear of cervix 12/15/2016   LSIL, get colpo  . Allergic reaction    recurrent  . Bladder infection, chronic   . Contraceptive management 07/14/2015  . GERD (gastroesophageal reflux disease)   . Hidradenitis   . 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 Hypertension   . Marijuana use   . Renal disorder    chronic bladder infections  . Supervision of normal pregnancy in first trimester 05/03/2013  . Swelling   . Vaginal Pap  smear, abnormal     Patient Active Problem List   Diagnosis Date Noted  . Abnormal Pap smear of cervix 12/15/2016  . History of abnormal cervical Pap smear 12/13/2016  . Encounter for surveillance of injectable contraceptive 12/13/2016  . 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 Contraceptive management 07/14/2015  . Angioedema 07/07/2015  . GERD without esophagitis 07/07/2015  . Urticaria 07/07/2015  . Family planning 09/18/2014  . Hypoactive sexual desire 08/06/2014  . Mood changes 08/06/2014  . Hidradenitis 04/23/2014  . Intertrigo labialis 10/30/2013  . Severe obesity (BMI >= 40) (HCC) 06/26/2013  . Marijuana use 06/24/2013    Past Surgical History:  Procedure Laterality Date  . TONSILLECTOMY      OB History    Gravida Para Term Preterm AB Living   1 1 1  0 0 1   SAB TAB Ectopic Multiple Live Births   0 0 0 0 1       Home Medications    Prior to Admission medications   Medication Sig Start Date End Date Taking? Authorizing Provider  diphenhydrAMINE (BENADRYL) 25 mg capsule Take 2 capsules (50 mg total) by mouth every 6 (six) hours as needed. Allergies 07/08/15   Erick Blinks, MD  EPINEPHrine (EPI-PEN) 0.3 mg/0.3 mL DEVI Inject 0.3 mLs (0.3 mg total) into the muscle once. 07/08/15   Erick Blinks, MD  famotidine (  PEPCID) 20 MG tablet Take 1 tablet (20 mg total) by mouth 2 (two) times daily. 07/08/15   Erick BlinksMemon, Jehanzeb, MD  medroxyPROGESTERone (DEPO-PROVERA) 150 MG/ML injection INJECT 1 ML (150 MG TOTAL) INTO THE MUSCLE EVERY 3 (THREE) MONTHS. 03/13/17   Adline PotterGriffin, Jennifer A, NP  predniSONE (DELTASONE) 20 MG tablet Take 2 tablets (40 mg total) by mouth daily with breakfast. For the next four days 07/20/17   Gerhard MunchLockwood, Robert, MD  silver sulfADIAZINE (SILVADENE) 1 % cream Use to area 3-4 times per day 01/03/17   Lazaro ArmsEure, Luther H, MD    Family History Family History  Problem Relation Age of Onset  . Diabetes Paternal Grandfather   . Heart disease Paternal Grandfather   .  Diabetes Paternal Grandmother   . Heart disease Paternal Grandmother   . Diabetes Maternal Grandmother   . Heart disease Maternal Grandmother   . Diabetes Maternal Grandfather   . Heart disease Maternal Grandfather   . Heart disease Mother     Social History Social History   Tobacco Use  . Smoking status: Current Every Day Smoker    Packs/day: 1.50    Years: 2.00    Pack years: 3.00    Types: Cigarettes  . Smokeless tobacco: Former NeurosurgeonUser    Types: Chew  Substance Use Topics  . Alcohol use: Yes    Alcohol/week: 0.0 oz    Comment: occ  . Drug use: No     Allergies   Nsaids; Aleve [naproxen sodium]; Amoxicillin; and Tylenol [acetaminophen]   Review of Systems Review of Systems  Constitutional: Negative for activity change.       All ROS Neg except as noted in HPI  HENT: Positive for facial swelling. Negative for nosebleeds, sneezing, sore throat, trouble swallowing and voice change.   Eyes: Positive for itching. Negative for photophobia and discharge.  Respiratory: Negative for cough, shortness of breath and wheezing.   Cardiovascular: Negative for chest pain and palpitations.  Gastrointestinal: Negative for abdominal pain and blood in stool.  Genitourinary: Negative for dysuria, frequency and hematuria.  Musculoskeletal: Negative for arthralgias, back pain and neck pain.  Skin: Negative.   Neurological: Negative for dizziness, seizures and speech difficulty.  Psychiatric/Behavioral: Negative for confusion and hallucinations.     Physical Exam Updated Vital Signs BP 128/71 (BP Location: Right Arm)   Pulse 94   Temp 98 F (36.7 C) (Oral)   Resp 20   Ht 5\' 9"  (1.753 m)   Wt 127 kg (280 lb)   SpO2 98%   BMI 41.35 kg/m   Physical Exam  Constitutional: She is oriented to person, place, and time. She appears well-developed and well-nourished.  Non-toxic appearance.  HENT:  Head: Normocephalic.  Right Ear: Tympanic membrane and external ear normal.  Left Ear:  Tympanic membrane and external ear normal.  Mild swelling of the lips. No swelling of the tongue. The oral pharynx is patent. Uvula is midline. No other facial swelling at this time.  Eyes: EOM and lids are normal. Pupils are equal, round, and reactive to light.  No swelling of the eyes at this time.  Neck: Normal range of motion. Neck supple. Carotid bruit is not present.  Cardiovascular: Normal rate, regular rhythm, normal heart sounds, intact distal pulses and normal pulses.  Pulmonary/Chest: Breath sounds normal. No respiratory distress.  There is symmetrical rise and fall of the chest.  Patient speaks in complete sentences without problem.  No stridor appreciated.  No wheezes, rales, or rhonchi.  Abdominal: Soft.  Bowel sounds are normal. There is no tenderness. There is no guarding.  Musculoskeletal: Normal range of motion.  Lymphadenopathy:       Head (right side): No submandibular adenopathy present.       Head (left side): No submandibular adenopathy present.    She has no cervical adenopathy.  Neurological: She is alert and oriented to person, place, and time. She has normal strength. No cranial nerve deficit or sensory deficit.  Skin: Skin is warm and dry. No rash noted.  Psychiatric: She has a normal mood and affect. Her speech is normal.  Nursing note and vitals reviewed.    ED Treatments / Results  Labs (all labs ordered are listed, but only abnormal results are displayed) Labs Reviewed - No data to display  EKG  EKG Interpretation None       Radiology No results found.  Procedures Procedures (including critical care time)  Medications Ordered in ED Medications  dexamethasone (DECADRON) injection 10 mg (not administered)  promethazine (PHENERGAN) tablet 25 mg (not administered)     Initial Impression / Assessment and Plan / ED Course  I have reviewed the triage vital signs and the nursing notes.  Pertinent labs & imaging results that were available  during my care of the patient were reviewed by me and considered in my medical decision making (see chart for details).      Final Clinical Impressions(s) / ED Diagnoses  Vital signs stable.  Pulse oximetry is 98% on room air. Patient took 50 mg of Benadryl and a dose of Pepcid prior to coming to the emergency department.  She states her symptoms are getting better.  We will add Decadron and promethazine at this time.  Will observe the patient.  Recheck. Patient resting comfortably.  Conversing with her gas without any problem whatsoever.  Pulse oximetry remains 98-100%.  Patient speaks in complete sentences without problem.  Swelling about the face is improving.  The oropharynx is clear.  There is no swelling about the tongue.  Given the patient resources for allergy evaluation.  I strongly encouraged her to see the allergist soon as possible.  I have invited the patient to return to the emergency department if any changes in her condition, recurrence of facial swelling, problems or concerns.   Final diagnoses:  Allergic reaction, initial encounter    ED Discharge Orders    None       Ivery Quale, PA-C 07/24/17 2020    Samuel Jester, DO 07/24/17 2214

## 2017-07-23 NOTE — Discharge Instructions (Signed)
Your vital signs within normal limits.  No new allergic reactions during her observation period here in the emergency department.  Please see an allergist as soon as possible for testing.  Continue your Benadryl and Pepcid.  Please return to the emergency department if any changes in your condition, problems, or concerns.

## 2017-07-23 NOTE — ED Triage Notes (Signed)
Patient states that she woke this morning with orbital swelling and swelling of lips at 6:30am in which she took x2 benadryl and a Pepcid (unsure of dosage). Denies any new foods, medications, soaps, etc. Per patient improvement in swelling around eyes but lips still swollen. Denies any swelling of tongue, difficulty breathing or swallowing.

## 2017-08-09 ENCOUNTER — Other Ambulatory Visit: Payer: Self-pay

## 2017-08-09 ENCOUNTER — Emergency Department (HOSPITAL_COMMUNITY)
Admission: EM | Admit: 2017-08-09 | Discharge: 2017-08-09 | Disposition: A | Discharge status: Home / Self Care | Payer: Self-pay | Attending: Emergency Medicine | Admitting: Emergency Medicine

## 2017-08-09 ENCOUNTER — Encounter (HOSPITAL_COMMUNITY): Payer: Self-pay | Admitting: Emergency Medicine

## 2017-08-09 DIAGNOSIS — J069 Acute upper respiratory infection, unspecified: Secondary | ICD-10-CM | POA: Insufficient documentation

## 2017-08-09 DIAGNOSIS — F17210 Nicotine dependence, cigarettes, uncomplicated: Secondary | ICD-10-CM | POA: Insufficient documentation

## 2017-08-09 DIAGNOSIS — I10 Essential (primary) hypertension: Secondary | ICD-10-CM | POA: Insufficient documentation

## 2017-08-09 MED ORDER — DEXAMETHASONE 4 MG PO TABS: 4.0000 mg | ORAL_TABLET | Freq: Two times a day (BID) | ORAL | 0 refills | Status: DC

## 2017-08-09 MED ORDER — PROMETHAZINE-DM 6.25-15 MG/5ML PO SYRP: 5.0000 mL | ORAL_SOLUTION | Freq: Four times a day (QID) | ORAL | 0 refills | Status: DC | PRN

## 2017-08-09 NOTE — ED Triage Notes (Signed)
Nasal congestion, coughing since Sunday morning.

## 2017-08-09 NOTE — ED Provider Notes (Signed)
[facility] EMERGENCY DEPARTMENT Provider Note   CSN: [id number] Arrival date & time: 08/09/17  1119     History   Chief Complaint Chief Complaint  Patient presents with  . Nasal Congestion    HPI Suzanne Short is a 22 y.o. female.  The history is provided by the patient.  URI   This is a new problem. There has been no fever. Associated symptoms include congestion, rhinorrhea, sneezing and cough. Pertinent negatives include no chest pain, no abdominal pain, no dysuria, no headaches, no sore throat, no neck pain and no wheezing. Treatments tried: OTC cold medications. The treatment provided no relief.    Past Medical History:  Diagnosis Date  . Abnormal Pap smear of cervix 12/15/2016   LSIL, get colpo  . Allergic reaction    recurrent  . Bladder infection, chronic   . Contraceptive management 07/14/2015  . GERD (gastroesophageal reflux disease)   . Hidradenitis   . 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 Hypertension   . Marijuana use   . Renal disorder    chronic bladder infections  . Supervision of normal pregnancy in first trimester 05/03/2013  . Swelling   . Vaginal Pap smear, abnormal     Patient Active Problem List   Diagnosis Date Noted  . Abnormal Pap smear of cervix 12/15/2016  . History of abnormal cervical Pap smear 12/13/2016  . Encounter for surveillance of injectable contraceptive 12/13/2016  . 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 Contraceptive management 07/14/2015  . Angioedema 07/07/2015  . GERD without esophagitis 07/07/2015  . Urticaria 07/07/2015  . Family planning 09/18/2014  . Hypoactive sexual desire 08/06/2014  . Mood changes 08/06/2014  . Hidradenitis 04/23/2014  . Intertrigo labialis 10/30/2013  . Severe obesity (BMI >= 40) (HCC) 06/26/2013  . Marijuana use 06/24/2013    Past Surgical History:  Procedure Laterality Date  . TONSILLECTOMY       OB History    Gravida  1   Para  1   Term  1   Preterm  0   AB  0   Living  1     SAB    0   TAB  0   Ectopic  0   Multiple  0   Live Births  1            Home Medications    Prior to Admission medications   Medication Sig Start Date End Date Taking? Authorizing Provider  diphenhydrAMINE (BENADRYL) 25 mg capsule Take 2 capsules (50 mg total) by mouth every 6 (six) hours as needed. Allergies 07/08/15   Erick Blinks, MD  EPINEPHrine (EPI-PEN) 0.3 mg/0.3 mL DEVI Inject 0.3 mLs (0.3 mg total) into the muscle once. 07/08/15   Erick Blinks, MD  famotidine (PEPCID) 20 MG tablet Take 1 tablet (20 mg total) by mouth 2 (two) times daily. 07/08/15   Erick Blinks, MD  medroxyPROGESTERone (DEPO-PROVERA) 150 MG/ML injection INJECT 1 ML (150 MG TOTAL) INTO THE MUSCLE EVERY 3 (THREE) MONTHS. 03/13/17   Adline Potter, NP  predniSONE (DELTASONE) 20 MG tablet Take 2 tablets (40 mg total) by mouth daily with breakfast. For the next four days 07/20/17   Gerhard Munch, MD  silver sulfADIAZINE (SILVADENE) 1 % cream Use to area 3-4 times per day 01/03/17   Lazaro Arms, MD    Family History Family History  Problem Relation Age of Onset  . Diabetes Paternal Grandfather   . Heart disease Paternal Grandfather   . Diabetes Paternal Grandmother   .  Heart disease Paternal Grandmother   . Diabetes Maternal Grandmother   . Heart disease Maternal Grandmother   . Diabetes Maternal Grandfather   . Heart disease Maternal Grandfather   . Heart disease Mother     Social History Social History   Tobacco Use  . Smoking status: Current Every Day Smoker    Packs/day: 1.50    Years: 2.00    Pack years: 3.00    Types: Cigarettes  . Smokeless tobacco: Former NeurosurgeonUser    Types: Chew  Substance Use Topics  . Alcohol use: Yes    Alcohol/week: 0.0 oz    Comment: occ  . Drug use: No     Allergies   Nsaids; Aleve [naproxen sodium]; Amoxicillin; and Tylenol [acetaminophen]   Review of Systems Review of Systems  Constitutional: Positive for chills. Negative for activity change,  appetite change and fever.       All ROS Neg except as noted in HPI  HENT: Positive for congestion, rhinorrhea and sneezing. Negative for nosebleeds and sore throat.   Eyes: Negative for photophobia and discharge.  Respiratory: Positive for cough. Negative for shortness of breath and wheezing.   Cardiovascular: Negative for chest pain and palpitations.  Gastrointestinal: Negative for abdominal pain and blood in stool.  Genitourinary: Negative for dysuria, frequency and hematuria.  Musculoskeletal: Negative for arthralgias, back pain and neck pain.  Skin: Negative.   Neurological: Negative for dizziness, seizures, speech difficulty and headaches.  Psychiatric/Behavioral: Negative for confusion and hallucinations.     Physical Exam Updated Vital Signs BP 126/68 (BP Location: Right Arm)   Pulse 82   Temp 98.1 F (36.7 C) (Oral)   Resp 20   Ht 5\' 9"  (1.753 m)   Wt 131.5 kg (290 lb)   SpO2 98%   BMI 42.83 kg/m   Physical Exam  Constitutional: She is oriented to person, place, and time. She appears well-developed and well-nourished.  Non-toxic appearance.  HENT:  Head: Normocephalic.  Right Ear: Tympanic membrane and external ear normal.  Left Ear: Tympanic membrane and external ear normal.  Nasal congestion present.  Eyes: Pupils are equal, round, and reactive to light. EOM and lids are normal.  Neck: Normal range of motion. Neck supple. Carotid bruit is not present.  Cardiovascular: Normal rate, regular rhythm, normal heart sounds, intact distal pulses and normal pulses.  Pulmonary/Chest: Breath sounds normal. No respiratory distress.  Abdominal: Soft. Bowel sounds are normal. There is no tenderness. There is no guarding.  Musculoskeletal: Normal range of motion.  Lymphadenopathy:       Head (right side): No submandibular adenopathy present.       Head (left side): No submandibular adenopathy present.    She has no cervical adenopathy.  Neurological: She is alert and oriented  to person, place, and time. She has normal strength. No cranial nerve deficit or sensory deficit.  Skin: Skin is warm and dry.  Psychiatric: She has a normal mood and affect. Her speech is normal.  Nursing note and vitals reviewed.    ED Treatments / Results  Labs (all labs ordered are listed, but only abnormal results are displayed) Labs Reviewed - No data to display  EKG None  Radiology No results found.  Procedures Procedures (including critical care time)  Medications Ordered in ED Medications - No data to display   Initial Impression / Assessment and Plan / ED Course  I have reviewed the triage vital signs and the nursing notes.  Pertinent labs & imaging results that  were available during my care of the patient were reviewed by me and considered in my medical decision making (see chart for details).       Final Clinical Impressions(s) / ED Diagnoses  MDM  Vital signs within normal limits.  Pulse oximetry is 98% on room air.  Within normal limits by my interpretation. Patient has been sick over the last 3 days with congestion, cough, body aches and generally not feeling well.  The examination favors an upper respiratory infection.  No acute findings noted on examination at this time.  The patient has mild lung findings.  The patient will be treated with Decadron and promethazine DM for cough.  The patient will use Tylenol every 4 hours for aching, and/or fever.  We discussed the importance of good handwashing.  We also discussed the importance of good hydration.  Patient will follow up with her primary physician or return to the emergency department if not improving.   Final diagnoses:  Upper respiratory tract infection, unspecified type    ED Discharge Orders        Ordered    dexamethasone (DECADRON) 4 MG tablet  2 times daily with meals     08/09/17 1152    promethazine-dextromethorphan (PROMETHAZINE-DM) 6.25-15 MG/5ML syrup  4 times daily PRN     08/09/17  1152       Ivery Quale, PA-C 08/09/17 1836    Doug Sou, MD 08/10/17 [phone]

## 2017-08-09 NOTE — Discharge Instructions (Addendum)
Your vital signs within normal limits.  Your oxygen level is 98% on room air.  Within normal limits by my interpretation.  Your examination favors an upper respiratory infection.  This is contagious.  Please wash hands frequently.  Use your mask until symptoms have resolved.  Please stop the plain Allegra for now.  Use Allegra-D  2 times daily for congestion and cough.  Use promethazine DM for cough.  This medication may cause drowsiness. This medication may cause drowsiness. Please do not drink, drive, or participate in activity that requires concentration while taking this medication.  Use Tylenol every 4 hours for fever or chills.  Please increase water, Gatorade, juices, etc.  See your primary physician, or return to the emergency department if any changes in your condition, problems, or concerns.

## 2017-08-26 ENCOUNTER — Other Ambulatory Visit: Payer: Self-pay

## 2017-08-26 ENCOUNTER — Encounter (HOSPITAL_COMMUNITY): Payer: Self-pay | Admitting: Emergency Medicine

## 2017-08-26 ENCOUNTER — Emergency Department (HOSPITAL_COMMUNITY)
Admission: EM | Admit: 2017-08-26 | Discharge: 2017-08-26 | Disposition: A | Discharge status: Home / Self Care | Payer: Self-pay | Attending: Emergency Medicine | Admitting: Emergency Medicine

## 2017-08-26 DIAGNOSIS — Z79899 Other long term (current) drug therapy: Secondary | ICD-10-CM | POA: Insufficient documentation

## 2017-08-26 DIAGNOSIS — I10 Essential (primary) hypertension: Secondary | ICD-10-CM | POA: Insufficient documentation

## 2017-08-26 DIAGNOSIS — T7840XA Allergy, unspecified, initial encounter: Secondary | ICD-10-CM | POA: Insufficient documentation

## 2017-08-26 DIAGNOSIS — F17210 Nicotine dependence, cigarettes, uncomplicated: Secondary | ICD-10-CM | POA: Insufficient documentation

## 2017-08-26 MED ORDER — DEXAMETHASONE 4 MG PO TABS: 4.0000 mg | ORAL_TABLET | Freq: Two times a day (BID) | ORAL | 0 refills | Status: DC

## 2017-08-26 MED ORDER — DEXAMETHASONE SODIUM PHOSPHATE 10 MG/ML IJ SOLN
10.0000 mg | Freq: Once | INTRAMUSCULAR | Status: AC
  Administered 2017-08-26: 10 mg via INTRAMUSCULAR
  Filled 2017-08-26: qty 1

## 2017-08-26 NOTE — ED Triage Notes (Signed)
Patient complaining of swelling to lower lip and itching to bilateral legs and arms starting at 0400 this morning. States she took two benadryl and a pepcid at 0400 when symptoms started. States she does not know what she is allergic to.

## 2017-08-26 NOTE — ED Provider Notes (Signed)
[facility] EMERGENCY DEPARTMENT Provider Note   CSN: [id number] Arrival date & time: 08/26/17  0732     History   Chief Complaint Chief Complaint  Patient presents with  . Oral Swelling    HPI Suzanne Short is a 22 y.o. female.  Patient is a 22 year old female who presents to the emergency department with a complaint of lip swelling.  Patient states she has had problems with allergic reactions since childhood.  She states that she required hospitalization for 3 days because of 1 of her allergic reactions.  At this point she is not have a trigger.  She is not been tested.  The patient states she does not have insurance and is unable to see an allergist.  Patient states that she was in her usual state of health until approximately 4:00 this morning.  She got up to get something to drink and noticed that her lower lip was swollen.  This was accompanied by itching on the inside of her arms and the inside of her thighs.  She did not see hives or whelps, but states she did not look carefully for this.  The patient took 2 Benadryl and Pepcid.  She was able to lay back down.  She did not have any difficulty with her breathing, no coughing, no wheezing.  The itching improved, but when the patient awakened again around 6:00 AM to go to work she noticed that she still had swelling of her lower lip.  She denies any swelling of her tongue again no problems with swallowing or speaking no difficulty with breathing.  She presents to the emergency department now for evaluation and for management.  She states she does not want to go through the anaphylactic type reaction that she had several years ago.     Past Medical History:  Diagnosis Date  . Abnormal Pap smear of cervix 12/15/2016   LSIL, get colpo  . Allergic reaction    recurrent  . Bladder infection, chronic   . Contraceptive management 07/14/2015  . GERD (gastroesophageal reflux disease)   . Hidradenitis   . 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  Hypertension   . Marijuana use   . Renal disorder    chronic bladder infections  . Supervision of normal pregnancy in first trimester 05/03/2013  . Swelling   . Vaginal Pap smear, abnormal     Patient Active Problem List   Diagnosis Date Noted  . Abnormal Pap smear of cervix 12/15/2016  . History of abnormal cervical Pap smear 12/13/2016  . Encounter for surveillance of injectable contraceptive 12/13/2016  . 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 Contraceptive management 07/14/2015  . Angioedema 07/07/2015  . GERD without esophagitis 07/07/2015  . Urticaria 07/07/2015  . Family planning 09/18/2014  . Hypoactive sexual desire 08/06/2014  . Mood changes 08/06/2014  . Hidradenitis 04/23/2014  . Intertrigo labialis 10/30/2013  . Severe obesity (BMI >= 40) (HCC) 06/26/2013  . Marijuana use 06/24/2013    Past Surgical History:  Procedure Laterality Date  . TONSILLECTOMY       OB History    Gravida  1   Para  1   Term  1   Preterm  0   AB  0   Living  1     SAB  0   TAB  0   Ectopic  0   Multiple  0   Live Births  1            Home Medications  Prior to Admission medications   Medication Sig Start Date End Date Taking? Authorizing Provider  dexamethasone (DECADRON) 4 MG tablet Take 1 tablet (4 mg total) by mouth 2 (two) times daily with a meal. 08/09/17   Ivery Quale, PA-C  diphenhydrAMINE (BENADRYL) 25 mg capsule Take 2 capsules (50 mg total) by mouth every 6 (six) hours as needed. Allergies 07/08/15   Erick Blinks, MD  EPINEPHrine (EPI-PEN) 0.3 mg/0.3 mL DEVI Inject 0.3 mLs (0.3 mg total) into the muscle once. 07/08/15   Erick Blinks, MD  famotidine (PEPCID) 20 MG tablet Take 1 tablet (20 mg total) by mouth 2 (two) times daily. 07/08/15   Erick Blinks, MD  medroxyPROGESTERone (DEPO-PROVERA) 150 MG/ML injection INJECT 1 ML (150 MG TOTAL) INTO THE MUSCLE EVERY 3 (THREE) MONTHS. 03/13/17   Adline Potter, NP  predniSONE (DELTASONE) 20 MG tablet  Take 2 tablets (40 mg total) by mouth daily with breakfast. For the next four days 07/20/17   Gerhard Munch, MD  promethazine-dextromethorphan (PROMETHAZINE-DM) 6.25-15 MG/5ML syrup Take 5 mLs by mouth 4 (four) times daily as needed for cough. 08/09/17   Ivery Quale, PA-C  silver sulfADIAZINE (SILVADENE) 1 % cream Use to area 3-4 times per day 01/03/17   Lazaro Arms, MD    Family History Family History  Problem Relation Age of Onset  . Diabetes Paternal Grandfather   . Heart disease Paternal Grandfather   . Diabetes Paternal Grandmother   . Heart disease Paternal Grandmother   . Diabetes Maternal Grandmother   . Heart disease Maternal Grandmother   . Diabetes Maternal Grandfather   . Heart disease Maternal Grandfather   . Heart disease Mother     Social History Social History   Tobacco Use  . Smoking status: Current Every Day Smoker    Packs/day: 1.50    Years: 2.00    Pack years: 3.00    Types: Cigarettes  . Smokeless tobacco: Former Neurosurgeon    Types: Chew  Substance Use Topics  . Alcohol use: Yes    Alcohol/week: 0.0 oz    Comment: occ  . Drug use: No     Allergies   Nsaids; Aleve [naproxen sodium]; Amoxicillin; and Tylenol [acetaminophen]   Review of Systems Review of Systems  Constitutional: Negative for activity change.       All ROS Neg except as noted in HPI  HENT: Positive for facial swelling. Negative for nosebleeds.   Eyes: Negative for photophobia and discharge.  Respiratory: Negative for cough, shortness of breath and wheezing.   Cardiovascular: Negative for chest pain and palpitations.  Gastrointestinal: Negative for abdominal pain and blood in stool.  Genitourinary: Negative for dysuria, frequency and hematuria.  Musculoskeletal: Negative for arthralgias, back pain and neck pain.  Skin: Negative.   Neurological: Negative for dizziness, seizures and speech difficulty.  Psychiatric/Behavioral: Negative for confusion and hallucinations.      Physical Exam Updated Vital Signs BP 124/73 (BP Location: Left Arm)   Pulse 94   Temp 97.8 F (36.6 C) (Oral)   Resp 20   Ht 5\' 9"  (1.753 m)   Wt 127 kg (280 lb)   SpO2 98%   BMI 41.35 kg/m   Physical Exam  Constitutional: She is oriented to person, place, and time. She appears well-developed and well-nourished.  Non-toxic appearance.  HENT:  Head: Normocephalic.  Right Ear: Tympanic membrane and external ear normal.  Left Ear: Tympanic membrane and external ear normal.  There is swelling of the lower lip.  There is no swelling of the upper lip.  No swelling of the tongue or the oropharynx.  The airway is patent.  The uvula is in the midline.  Eyes: Pupils are equal, round, and reactive to light. EOM and lids are normal.  Neck: Normal range of motion. Neck supple. Carotid bruit is not present.  Cardiovascular: Normal rate, regular rhythm, normal heart sounds, intact distal pulses and normal pulses.  Pulmonary/Chest: Breath sounds normal. No respiratory distress.  The patient speaks in complete sentences without problem.  There is symmetrical rise and fall of the chest without problem.  No wheezes, rales, or rhonchi.  Abdominal: Soft. Bowel sounds are normal. There is no tenderness. There is no guarding.  Musculoskeletal: Normal range of motion. She exhibits no edema.  Lymphadenopathy:       Head (right side): No submandibular adenopathy present.       Head (left side): No submandibular adenopathy present.    She has no cervical adenopathy.  Neurological: She is alert and oriented to person, place, and time. She has normal strength. No cranial nerve deficit or sensory deficit.  Skin: Skin is warm and dry. No rash noted.  No hives or rash of any kind.  Psychiatric: She has a normal mood and affect. Her speech is normal.  Nursing note and vitals reviewed.    ED Treatments / Results  Labs (all labs ordered are listed, but only abnormal results are displayed) Labs  Reviewed - No data to display  EKG None  Radiology No results found.  Procedures Procedures (including critical care time)  Medications Ordered in ED Medications  dexamethasone (DECADRON) injection 10 mg (has no administration in time range)     Initial Impression / Assessment and Plan / ED Course  I have reviewed the triage vital signs and the nursing notes.  Pertinent labs & imaging results that were available during my care of the patient were reviewed by me and considered in my medical decision making (see chart for details).       Final Clinical Impressions(s) / ED Diagnoses MDM Vital signs reviewed.  Pulse oximetry is 98% on room air.  The patient speaks in complete sentences without problem.  She is in no distress whatsoever.  There is no complaint of itching at this time.  And no new areas of swelling.  The patient will be treated with Decadron 10 mg intramuscularly.  The patient took Benadryl and Pepcid prior to her arrival here in the emergency department.  We will observe the patient for now.  Recheck: No distress noted.  Patient is texting on her phone without any problem.  Pulse oximetry remains at 98%.  Heart rate and blood pressure remained stable.  No stridor appreciated.  The swelling of the lower lip is not advancing at this time.  Recheck: The patient states that she no longer feels like there is a pulse in her lip.  It seems as though the swelling may be going down slightly.  The airway is patent.  There is no swelling of the tongue.  There is no stridor appreciated.  The patient speaks in complete sentences without any problem whatsoever, the patient is in no distress.  Patient has been observed here in the emergency department with no advance in allergic reaction symptoms.  Feel that it is safe for the patient to be discharged home.  I again emphasized to the patient the importance of getting allergy testing.  The patient is going to contact the [facility] to see if there is any way they can help her in getting to a specialist.  The patient is encouraged to return to the emergency department if any changes in her condition, advances in allergic reaction, changes or problems.  Patient is in agreement with this plan.  Prescription for steroid medication given for the patient to add to her Benadryl and Pepcid that she is currently taking.   Final diagnoses:  Allergic reaction, initial encounter    ED Discharge Orders        Ordered    dexamethasone (DECADRON) 4 MG tablet  2 times daily with meals     08/26/17 0948       Ivery Quale, PA-C 08/26/17 0957    Samuel Jester, DO 08/27/17 [phone]

## 2017-08-26 NOTE — Discharge Instructions (Addendum)
Your vital signs within normal limits.  Your oxygen level is 98% on room air.  Please continue your Benadryl and Pepcid.  Please add Decadron 2 times daily with food.  Please return to the emergency department immediately if any new reaction, problems, or concerns.  Please [facility] the [facility] for assistance with your medical health, as well as securing a primary physician and or allergy specialist.

## 2017-08-29 ENCOUNTER — Ambulatory Visit (INDEPENDENT_AMBULATORY_CARE_PROVIDER_SITE_OTHER): Payer: Medicaid Other | Admitting: *Deleted

## 2017-08-29 DIAGNOSIS — Z3202 Encounter for pregnancy test, result negative: Secondary | ICD-10-CM

## 2017-08-29 DIAGNOSIS — Z3049 Encounter for surveillance of other contraceptives: Secondary | ICD-10-CM

## 2017-08-29 DIAGNOSIS — Z3042 Encounter for surveillance of injectable contraceptive: Secondary | ICD-10-CM

## 2017-08-29 LAB — POCT URINE PREGNANCY: PREG TEST UR: NEGATIVE

## 2017-08-29 MED ORDER — MEDROXYPROGESTERONE ACETATE 150 MG/ML IM SUSP
150.0000 mg | Freq: Once | INTRAMUSCULAR | Status: AC
  Administered 2017-08-29: 150 mg via INTRAMUSCULAR

## 2017-08-29 NOTE — Progress Notes (Signed)
Depo Provera given IM in right deltoid. Patient tolerated well. Return in 12 weeks.

## 2017-09-01 ENCOUNTER — Emergency Department (HOSPITAL_COMMUNITY)
Admission: EM | Admit: 2017-09-01 | Discharge: 2017-09-01 | Disposition: A | Discharge status: Home / Self Care | Payer: Self-pay | Attending: Emergency Medicine | Admitting: Emergency Medicine

## 2017-09-01 ENCOUNTER — Emergency Department (HOSPITAL_COMMUNITY): Payer: Self-pay

## 2017-09-01 ENCOUNTER — Encounter (HOSPITAL_COMMUNITY): Payer: Self-pay

## 2017-09-01 ENCOUNTER — Other Ambulatory Visit: Payer: Self-pay

## 2017-09-01 DIAGNOSIS — Z79899 Other long term (current) drug therapy: Secondary | ICD-10-CM | POA: Insufficient documentation

## 2017-09-01 DIAGNOSIS — I10 Essential (primary) hypertension: Secondary | ICD-10-CM | POA: Insufficient documentation

## 2017-09-01 DIAGNOSIS — F17210 Nicotine dependence, cigarettes, uncomplicated: Secondary | ICD-10-CM | POA: Insufficient documentation

## 2017-09-01 DIAGNOSIS — R6883 Chills (without fever): Secondary | ICD-10-CM | POA: Insufficient documentation

## 2017-09-01 DIAGNOSIS — K529 Noninfective gastroenteritis and colitis, unspecified: Secondary | ICD-10-CM | POA: Insufficient documentation

## 2017-09-01 LAB — POC URINE PREG, ED: PREG TEST UR: NEGATIVE

## 2017-09-01 LAB — URINALYSIS, ROUTINE W REFLEX MICROSCOPIC
Glucose, UA: NEGATIVE mg/dL
Hgb urine dipstick: NEGATIVE
Ketones, ur: NEGATIVE mg/dL
Nitrite: NEGATIVE
PROTEIN: 30 mg/dL — AB
SPECIFIC GRAVITY, URINE: 1.031 — AB (ref 1.005–1.030)
pH: 5 (ref 5.0–8.0)

## 2017-09-01 LAB — CBC
HEMATOCRIT: 48.6 % — AB (ref 36.0–46.0)
Hemoglobin: 16 g/dL — ABNORMAL HIGH (ref 12.0–15.0)
MCH: 29.4 pg (ref 26.0–34.0)
MCHC: 32.9 g/dL (ref 30.0–36.0)
MCV: 89.2 fL (ref 78.0–100.0)
Platelets: 237 10*3/uL (ref 150–400)
RBC: 5.45 MIL/uL — ABNORMAL HIGH (ref 3.87–5.11)
RDW: 13.1 % (ref 11.5–15.5)
WBC: 13.8 10*3/uL — ABNORMAL HIGH (ref 4.0–10.5)

## 2017-09-01 LAB — COMPREHENSIVE METABOLIC PANEL
ALBUMIN: 3.8 g/dL (ref 3.5–5.0)
ALT: 40 U/L (ref 14–54)
AST: 27 U/L (ref 15–41)
Alkaline Phosphatase: 85 U/L (ref 38–126)
Anion gap: 13 (ref 5–15)
BUN: 10 mg/dL (ref 6–20)
CHLORIDE: 104 mmol/L (ref 101–111)
CO2: 21 mmol/L — ABNORMAL LOW (ref 22–32)
Calcium: 9 mg/dL (ref 8.9–10.3)
Creatinine, Ser: 0.74 mg/dL (ref 0.44–1.00)
GFR calc Af Amer: >60 mL/min (ref >60)
GFR calc non Af Amer: >60 mL/min (ref >60)
GLUCOSE: 86 mg/dL (ref 65–99)
POTASSIUM: 3.6 mmol/L (ref 3.5–5.1)
Sodium: 138 mmol/L (ref 135–145)
Total Bilirubin: 0.6 mg/dL (ref 0.3–1.2)
Total Protein: 7.4 g/dL (ref 6.5–8.1)

## 2017-09-01 LAB — LIPASE, BLOOD: LIPASE: 25 U/L (ref 11–51)

## 2017-09-01 MED ORDER — IOPAMIDOL (ISOVUE-300) INJECTION 61%
100.0000 mL | Freq: Once | INTRAVENOUS | Status: AC | PRN
  Administered 2017-09-01: 100 mL via INTRAVENOUS

## 2017-09-01 MED ORDER — ONDANSETRON HCL 4 MG/2ML IJ SOLN
4.0000 mg | Freq: Once | INTRAMUSCULAR | Status: AC
  Administered 2017-09-01: 4 mg via INTRAVENOUS
  Filled 2017-09-01: qty 2

## 2017-09-01 MED ORDER — ONDANSETRON HCL 4 MG PO TABS: 4.0000 mg | ORAL_TABLET | Freq: Three times a day (TID) | ORAL | 0 refills | Status: DC | PRN

## 2017-09-01 MED ORDER — FENTANYL CITRATE (PF) 100 MCG/2ML IJ SOLN
100.0000 ug | Freq: Once | INTRAMUSCULAR | Status: AC
  Administered 2017-09-01: 100 ug via INTRAVENOUS
  Filled 2017-09-01: qty 2

## 2017-09-01 MED ORDER — CIPROFLOXACIN HCL 500 MG PO TABS: 500.0000 mg | ORAL_TABLET | Freq: Two times a day (BID) | ORAL | 0 refills | Status: DC

## 2017-09-01 NOTE — ED Triage Notes (Signed)
Pt reports that she started with watery diarrhea since last Saturday. Reports conts to have at least 3 episodes of watery per day. Also reports vomiting and unable to keep anything down. No fever, but gets very clammy. Pt reports pain right flank and across upper abdomen

## 2017-09-01 NOTE — ED Provider Notes (Signed)
[facility] EMERGENCY DEPARTMENT Provider Note   CSN: [id number] Arrival date & time: 09/01/17  1707     History   Chief Complaint Chief Complaint  Patient presents with  . Diarrhea  . Emesis    HPI Suzanne Short is a 22 y.o. female.   Diarrhea   This is a new problem. The current episode started more than 2 days ago. The problem occurs 2 to 4 times per day. The problem has not changed since onset.The stool consistency is described as watery. There has been no fever (did have chills but no known fever). Associated symptoms include abdominal pain and vomiting.  Emesis   Associated symptoms include abdominal pain and diarrhea.    Past Medical History:  Diagnosis Date  . Abnormal Pap smear of cervix 12/15/2016   LSIL, get colpo  . Allergic reaction    recurrent  . Bladder infection, chronic   . Contraceptive management 07/14/2015  . GERD (gastroesophageal reflux disease)   . Hidradenitis   . 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 Hypertension   . Marijuana use   . Renal disorder    chronic bladder infections  . Supervision of normal pregnancy in first trimester 05/03/2013  . Swelling   . Vaginal Pap smear, abnormal     Patient Active Problem List   Diagnosis Date Noted  . Abnormal Pap smear of cervix 12/15/2016  . History of abnormal cervical Pap smear 12/13/2016  . Encounter for surveillance of injectable contraceptive 12/13/2016  . 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 Contraceptive management 07/14/2015  . Angioedema 07/07/2015  . GERD without esophagitis 07/07/2015  . Urticaria 07/07/2015  . Family planning 09/18/2014  . Hypoactive sexual desire 08/06/2014  . Mood changes 08/06/2014  . Hidradenitis 04/23/2014  . Intertrigo labialis 10/30/2013  . Severe obesity (BMI >= 40) (HCC) 06/26/2013  . Marijuana use 06/24/2013    Past Surgical History:  Procedure Laterality Date  . TONSILLECTOMY       OB History    Gravida  1   Para  1   Term  1   Preterm  0   AB    0   Living  1     SAB  0   TAB  0   Ectopic  0   Multiple  0   Live Births  1            Home Medications    Prior to Admission medications   Medication Sig Start Date End Date Taking? Authorizing Provider  diphenhydrAMINE (BENADRYL) 25 mg capsule Take 2 capsules (50 mg total) by mouth every 6 (six) hours as needed. Allergies 07/08/15  Yes Erick Blinks, MD  EPINEPHrine (EPI-PEN) 0.3 mg/0.3 mL DEVI Inject 0.3 mLs (0.3 mg total) into the muscle once. 07/08/15  Yes Erick Blinks, MD  famotidine (PEPCID) 20 MG tablet Take 1 tablet (20 mg total) by mouth 2 (two) times daily. 07/08/15  Yes Erick Blinks, MD  medroxyPROGESTERone (DEPO-PROVERA) 150 MG/ML injection INJECT 1 ML (150 MG TOTAL) INTO THE MUSCLE EVERY 3 (THREE) MONTHS. 03/13/17  Yes Adline Potter, NP  silver sulfADIAZINE (SILVADENE) 1 % cream Use to area 3-4 times per day Patient taking differently: Apply 1 application topically 4 (four) times daily. Use to area 3-4 times per day 01/03/17  Yes Lazaro Arms, MD  ciprofloxacin (CIPRO) 500 MG tablet Take 1 tablet (500 mg total) by mouth 2 (two) times daily. 09/01/17   Keith Cancio, Barbara Cower, MD  ondansetron (ZOFRAN) 4 MG tablet  Take 1 tablet (4 mg total) by mouth every 8 (eight) hours as needed for nausea or vomiting. 09/01/17   Diar Berkel, Barbara CowerJason, MD    Family History Family History  Problem Relation Age of Onset  . Diabetes Paternal Grandfather   . Heart disease Paternal Grandfather   . Diabetes Paternal Grandmother   . Heart disease Paternal Grandmother   . Diabetes Maternal Grandmother   . Heart disease Maternal Grandmother   . Diabetes Maternal Grandfather   . Heart disease Maternal Grandfather   . Heart disease Mother     Social History Social History   Tobacco Use  . Smoking status: Current Every Day Smoker    Packs/day: 1.50    Years: 2.00    Pack years: 3.00    Types: Cigarettes  . Smokeless tobacco: Former NeurosurgeonUser    Types: Chew  Substance Use Topics  .  Alcohol use: Yes    Alcohol/week: 0.0 oz    Comment: occ  . Drug use: No     Allergies   Nsaids; Aleve [naproxen sodium]; Amoxicillin; and Tylenol [acetaminophen]   Review of Systems Review of Systems  Gastrointestinal: Positive for abdominal pain, diarrhea and vomiting.  All other systems reviewed and are negative.    Physical Exam Updated Vital Signs BP (!) 108/56   Pulse (!) 58   Temp 97.9 F (36.6 C) (Oral)   Resp 18   SpO2 97%   Physical Exam  Constitutional: She is oriented to person, place, and time. She appears well-developed and well-nourished.  HENT:  Head: Normocephalic and atraumatic.  Eyes: Conjunctivae and EOM are normal.  Neck: Normal range of motion.  Cardiovascular: Normal rate and regular rhythm.  Pulmonary/Chest: Effort normal and breath sounds normal. No stridor. No respiratory distress.  Abdominal: She exhibits no distension. There is tenderness (right sided and in flank on same side ).  Musculoskeletal: She exhibits no edema or deformity.  Neurological: She is alert and oriented to person, place, and time.  Nursing note and vitals reviewed.    ED Treatments / Results  Labs (all labs ordered are listed, but only abnormal results are displayed) Labs Reviewed  COMPREHENSIVE METABOLIC PANEL - Abnormal; Notable for the following components:      Result Value   CO2 21 (*)    All other components within normal limits  CBC - Abnormal; Notable for the following components:   WBC 13.8 (*)    RBC 5.45 (*)    Hemoglobin 16.0 (*)    HCT 48.6 (*)    All other components within normal limits  URINALYSIS, ROUTINE W REFLEX MICROSCOPIC - Abnormal; Notable for the following components:   APPearance TURBID (*)    Specific Gravity, Urine 1.031 (*)    Bilirubin Urine SMALL (*)    Protein, ur 30 (*)    Leukocytes, UA TRACE (*)    Bacteria, UA FEW (*)    Squamous Epithelial / LPF TOO NUMEROUS TO COUNT (*)    All other components within normal limits    GASTROINTESTINAL PANEL BY PCR, STOOL (REPLACES STOOL CULTURE)  LIPASE, BLOOD  POC URINE PREG, ED    EKG None  Radiology Ct Abdomen Pelvis W Contrast  Result Date: 09/01/2017 CLINICAL DATA:  Nausea and vomiting since Sunday. Epigastric intermittent pain and diarrhea. EXAM: CT ABDOMEN AND PELVIS WITH CONTRAST TECHNIQUE: Multidetector CT imaging of the abdomen and pelvis was performed using the standard protocol following bolus administration of intravenous contrast. CONTRAST:  100mL ISOVUE-300 IOPAMIDOL (ISOVUE-300) INJECTION  61% COMPARISON:  None. FINDINGS: Lower chest: Minimal right basilar atelectasis. Normal sized included heart without pericardial effusion. Hepatobiliary: No focal liver abnormality is seen. No gallstones, gallbladder wall thickening, or biliary dilatation. Pancreas: Unremarkable. No pancreatic ductal dilatation or surrounding inflammatory changes. Spleen: Normal in size without focal abnormality. Adrenals/Urinary Tract: Adrenal glands are unremarkable. Kidneys are normal, without renal calculi, focal lesion, or hydronephrosis. Bladder is decompressed in appearance. Stomach/Bowel: Decompressed stomach. Mild diffuse transmural thickening and fluid-filled distention of duodenum and jejunum without mechanical bowel obstruction. Liquid stool noted within the colon to the level of the rectum without mural thickening. Unremarkable diminutive appendix. Vascular/Lymphatic: No significant vascular findings are present. No enlarged abdominal or pelvic lymph nodes. Reproductive: Uterus and bilateral adnexa are unremarkable. Other: No abdominal wall hernia or abnormality. No abdominopelvic ascites. Musculoskeletal: No acute or significant osseous findings. IMPRESSION: 1. Mild diffuse transmural thickening of small bowel with fluid-filled distention compatible with small bowel enteritis. 2. Liquid stool within the colon without mural thickening or inflammation compatible with diarrheal disease.  Electronically Signed   By: Tollie Eth M.D.   On: 09/01/2017 20:55    Procedures Procedures (including critical care time)  Medications Ordered in ED Medications  ondansetron (ZOFRAN) injection 4 mg (4 mg Intravenous Given 09/01/17 1927)  fentaNYL (SUBLIMAZE) injection 100 mcg (100 mcg Intravenous Given 09/01/17 1927)  iopamidol (ISOVUE-300) 61 % injection 100 mL (100 mLs Intravenous Contrast Given 09/01/17 2041)     Initial Impression / Assessment and Plan / ED Course  I have reviewed the triage vital signs and the nursing notes.  Pertinent labs & imaging results that were available during my care of the patient were reviewed by me and considered in my medical decision making (see chart for details).    Colitis vs diverticulitis vs appendicitis vs gastric outlet obstruction? Labs/ct/urine. Symptom management.  Enteritis. With week of symptoms and possible UTI will treat with cipro.   Final Clinical Impressions(s) / ED Diagnoses   Final diagnoses:  Enteritis    ED Discharge Orders        Ordered    ciprofloxacin (CIPRO) 500 MG tablet  2 times daily     09/01/17 2137    ondansetron (ZOFRAN) 4 MG tablet  Every 8 hours PRN     09/01/17 2137       Marily Memos, MD 09/01/17 2322

## 2017-11-15 ENCOUNTER — Other Ambulatory Visit: Payer: Self-pay | Admitting: Adult Health

## 2017-11-21 ENCOUNTER — Ambulatory Visit: Payer: Medicaid Other

## 2017-11-24 ENCOUNTER — Ambulatory Visit (INDEPENDENT_AMBULATORY_CARE_PROVIDER_SITE_OTHER): Payer: Medicaid Other | Admitting: *Deleted

## 2017-11-24 ENCOUNTER — Encounter: Payer: Self-pay | Admitting: *Deleted

## 2017-11-24 VITALS — Wt 280.8 lb

## 2017-11-24 DIAGNOSIS — Z3042 Encounter for surveillance of injectable contraceptive: Secondary | ICD-10-CM

## 2017-11-24 DIAGNOSIS — Z3202 Encounter for pregnancy test, result negative: Secondary | ICD-10-CM | POA: Diagnosis not present

## 2017-11-24 LAB — POCT URINE PREGNANCY: Preg Test, Ur: NEGATIVE

## 2017-11-24 MED ORDER — MEDROXYPROGESTERONE ACETATE 150 MG/ML IM SUSP
150.0000 mg | Freq: Once | INTRAMUSCULAR | Status: AC
Start: 2017-11-24 — End: 2017-11-24
  Administered 2017-11-24: 150 mg via INTRAMUSCULAR

## 2017-11-24 NOTE — Progress Notes (Signed)
Depo Provera 150mg IM given in left deltoid with no complications. Pt to return in 12 weeks for next injection.  

## 2018-02-15 ENCOUNTER — Ambulatory Visit: Payer: Medicaid Other

## 2018-02-22 ENCOUNTER — Ambulatory Visit (INDEPENDENT_AMBULATORY_CARE_PROVIDER_SITE_OTHER): Payer: Medicaid Other | Admitting: Adult Health

## 2018-02-22 ENCOUNTER — Other Ambulatory Visit (HOSPITAL_COMMUNITY)
Admission: RE | Admit: 2018-02-22 | Discharge: 2018-02-22 | Disposition: A | Discharge status: Home / Self Care | Payer: Medicaid Other | Source: Ambulatory Visit | Attending: Adult Health | Admitting: Adult Health

## 2018-02-22 ENCOUNTER — Encounter: Payer: Self-pay | Admitting: Adult Health

## 2018-02-22 VITALS — BP 121/82 | HR 75 | Ht 67.25 in | Wt 278.5 lb

## 2018-02-22 DIAGNOSIS — Z3042 Encounter for surveillance of injectable contraceptive: Secondary | ICD-10-CM

## 2018-02-22 DIAGNOSIS — Z113 Encounter for screening for infections with a predominantly sexual mode of transmission: Secondary | ICD-10-CM | POA: Insufficient documentation

## 2018-02-22 DIAGNOSIS — Z3009 Encounter for other general counseling and advice on contraception: Secondary | ICD-10-CM

## 2018-02-22 DIAGNOSIS — Z309 Encounter for contraceptive management, unspecified: Secondary | ICD-10-CM | POA: Diagnosis not present

## 2018-02-22 DIAGNOSIS — Z01419 Encounter for gynecological examination (general) (routine) without abnormal findings: Secondary | ICD-10-CM

## 2018-02-22 DIAGNOSIS — L732 Hidradenitis suppurativa: Secondary | ICD-10-CM

## 2018-02-22 DIAGNOSIS — Z8742 Personal history of other diseases of the female genital tract: Secondary | ICD-10-CM

## 2018-02-22 MED ORDER — MEDROXYPROGESTERONE ACETATE 150 MG/ML IM SUSP: 150.0000 mg | INTRAMUSCULAR | 3 refills | Status: DC

## 2018-02-22 MED ORDER — SILVER SULFADIAZINE 1 % EX CREA: TOPICAL_CREAM | CUTANEOUS | 11 refills | Status: DC

## 2018-02-22 NOTE — Progress Notes (Signed)
Patient ID: Suzanne Short, female   DOB: [birth year], 22 y.o.   MRN: [id number] History of Present Illness: Suzanne Short is a 22 year old white female in for well woman gyn exam and pap, her pap last year was LSIL.She had colpo with Dr Despina Hidden.  PCP is Dr Phillips Odor.    Current Medications, Allergies, Past Medical History, Past Surgical History, Family History and Social History were reviewed in [employer] record.     Review of Systems:  Patient denies any headaches, hearing loss, fatigue, blurred vision, shortness of breath, chest pain, abdominal pain, problems with bowel movements, urination, or intercourse. No joint pain or mood swings.She is happy with her depo.    Physical Exam:BP 121/82 (BP Location: Left Arm, Patient Position: Sitting, Cuff Size: Large)   Pulse 75   Ht 5' 7.25" (1.708 m)   Wt 278 lb 8 oz (126.3 kg)   BMI 43.30 kg/m  General:  Well developed, well nourished, no acute distress Skin:  Warm and dry Neck:  Midline trachea, normal thyroid, good ROM, no lymphadenopathy Lungs; Clear to auscultation bilaterally Breast:  No dominant palpable mass, retraction, or nipple discharge,has scarring from old hidradenitis  Cardiovascular: Regular rate and rhythm Abdomen:  Soft, non tender, no hepatosplenomegaly Pelvic:  External genitalia is normal in appearance, no lesions.  The vagina is normal in appearance. Urethra has no lesions or masses. The cervix is bulbous.Pap with GC/CHL performed.  Uterus is felt to be normal size, shape, and contour.  No adnexal masses or tenderness noted.Bladder is non tender, no masses felt.+scarring both inner thighs, no active boils. Extremities/musculoskeletal:  No swelling or varicosities noted, no clubbing or cyanosis Psych:  No mood changes, alert and cooperative,seems happy PHQ 2 score 0. Examination chaperoned by Malachy Mood LPN.  Impression: 1. Encounter for gynecological examination with Papanicolaou smear of cervix   2. Family  planning   3. Screening examination for STD (sexually transmitted disease)   4. History of abnormal cervical Pap smear   5. Hidradenitis   6. Encounter for surveillance of injectable contraceptive       Plan: Check HIV and RPR Meds ordered this encounter  Medications  . silver sulfADIAZINE (SILVADENE) 1 % cream    Sig: Use to area 3-4 times per day    Dispense:  50 g    Refill:  11    Order Specific Question:   Supervising Provider    Answer:   Despina Hidden, LUTHER H [[id number]]  . medroxyPROGESTERone (DEPO-PROVERA) 150 MG/ML injection    Sig: Inject 1 mL (150 mg total) into the muscle every 3 (three) months.    Dispense:  1 mL    Refill:  3    Order Specific Question:   Supervising Provider    Answer:   Duane Lope H [[id number]]   Return in 1 day for depo Physical in 1 year Pap in 3 if normal

## 2018-02-23 ENCOUNTER — Ambulatory Visit (INDEPENDENT_AMBULATORY_CARE_PROVIDER_SITE_OTHER): Payer: Medicaid Other

## 2018-02-23 VITALS — Ht 67.0 in | Wt 279.0 lb

## 2018-02-23 DIAGNOSIS — Z3202 Encounter for pregnancy test, result negative: Secondary | ICD-10-CM | POA: Diagnosis not present

## 2018-02-23 DIAGNOSIS — Z3042 Encounter for surveillance of injectable contraceptive: Secondary | ICD-10-CM | POA: Diagnosis not present

## 2018-02-23 LAB — RPR: RPR Ser Ql: NONREACTIVE

## 2018-02-23 LAB — POCT URINE PREGNANCY: PREG TEST UR: NEGATIVE

## 2018-02-23 LAB — HIV ANTIBODY (ROUTINE TESTING W REFLEX): HIV SCREEN 4TH GENERATION: NONREACTIVE

## 2018-02-23 MED ORDER — MEDROXYPROGESTERONE ACETATE 150 MG/ML IM SUSP
150.0000 mg | Freq: Once | INTRAMUSCULAR | Status: AC
  Administered 2018-02-23: 150 mg via INTRAMUSCULAR

## 2018-02-23 NOTE — Progress Notes (Signed)
Pt here for depo injection 150 mg IM given rt deltoid. Tolerated well. Return 12 weeks for next injection. Pad CMA 

## 2018-02-27 LAB — CYTOLOGY - PAP
Chlamydia: NEGATIVE
HPV (WINDOPATH): DETECTED — AB
NEISSERIA GONORRHEA: NEGATIVE

## 2018-02-28 ENCOUNTER — Telehealth: Payer: Self-pay | Admitting: Adult Health

## 2018-02-28 NOTE — Telephone Encounter (Signed)
Left message to call this afternoon, she was in bed asleep

## 2018-02-28 NOTE — Telephone Encounter (Signed)
Pt aware that pap is LSIL with +HPV with negative GC/CHL, will need colpo, to make appt. Is trying to get insurance at work

## 2018-03-20 ENCOUNTER — Other Ambulatory Visit: Payer: Medicaid Other | Admitting: Adult Health

## 2018-03-30 ENCOUNTER — Encounter: Payer: Self-pay | Admitting: Obstetrics & Gynecology

## 2018-03-30 ENCOUNTER — Ambulatory Visit: Payer: BLUE CROSS/BLUE SHIELD | Admitting: Obstetrics & Gynecology

## 2018-03-30 VITALS — BP 143/86 | HR 97 | Ht 69.0 in | Wt 272.5 lb

## 2018-03-30 DIAGNOSIS — R87612 Low grade squamous intraepithelial lesion on cytologic smear of cervix (LGSIL): Secondary | ICD-10-CM

## 2018-03-30 DIAGNOSIS — Z3202 Encounter for pregnancy test, result negative: Secondary | ICD-10-CM

## 2018-03-30 NOTE — Progress Notes (Signed)
Colposcopy Procedure Note:  Colposcopy Procedure Note  Indications: Pap smear 1 months ago showed: low-grade squamous intraepithelial neoplasia (LGSIL - encompassing HPV,mild dysplasia,CIN I). The prior pap showed low-grade squamous intraepithelial neoplasia (LGSIL - encompassing HPV,mild dysplasia,CIN I).  Prior cervical/vaginal disease: LSIL. Prior cervical treatment: no treatment.  Smoker:  Yes.   New sexual partner:  No.    History of abnormal Pap: yes  Procedure Details  The risks and benefits of the procedure and Written informed consent obtained.  Speculum placed in vagina and excellent visualization of cervix achieved, cervix swabbed x 3 with acetic acid solution.  Findings: Cervix: visible lesion(s) at 6 o'clock and acetowhite lesion(s) noted at 6  O'clock; stable from last year no biopsies taken and SCJ visualized - lesion at 6 o'clock. Vaginal inspection: normal without visible lesions. Vulvar colposcopy: vulvar colposcopy not performed.  Specimens: none  Complications: none.  Plan: Repeat Pap 1 year  See last years colpo note below as well  Colposcopy Procedure Note:  Colposcopy Procedure Note  Indications: Pap smear 1 months ago showed: low-grade squamous intraepithelial neoplasia (LGSIL - encompassing HPV,mild dysplasia,CIN I). The prior pap showed ASCUS with POSITIVE high risk HPV.  Prior cervical/vaginal disease: na. Prior cervical treatment: no treatment.  Smoker:  Yes.   New sexual partner:  No.  :   History of abnormal Pap: yes  Procedure Details  The risks and benefits of the procedure and Written informed consent obtained.  Speculum placed in vagina and excellent visualization of cervix achieved, cervix swabbed x 3 with acetic acid solution.  Findings: Cervix: visible lesion(s) at 12 o'clock, acetowhite lesion(s) noted at 12 o'clock, punctation noted at 12 o'clock and mosaicism noted at 12 o'clock; SCJ visualized - lesion at 12-1 o'clock, cervical  biopsies taken at 12-1 o'clock, specimen labelled and sent to pathology and hemostasis achieved with Monsel's solution. Vaginal inspection: normal without visible lesions. Vulvar colposcopy: vulvar colposcopy not performed.  Specimens: cervical biopsy x 1  Complications: none.  Plan: Specimens labelled and sent to Pathology. Send my chart message regarding reuslts  Dx LSIL on biospy

## 2018-04-10 ENCOUNTER — Telehealth: Payer: Self-pay | Admitting: Psychiatry

## 2018-04-10 NOTE — Telephone Encounter (Signed)
Pt. Called, something came up at work that is preventing her from leaving for her appt.   So sorry

## 2018-05-18 ENCOUNTER — Ambulatory Visit (INDEPENDENT_AMBULATORY_CARE_PROVIDER_SITE_OTHER): Payer: Medicaid Other | Admitting: *Deleted

## 2018-05-18 ENCOUNTER — Other Ambulatory Visit: Payer: Self-pay

## 2018-05-18 ENCOUNTER — Encounter: Payer: Self-pay | Admitting: *Deleted

## 2018-05-18 DIAGNOSIS — Z3042 Encounter for surveillance of injectable contraceptive: Secondary | ICD-10-CM | POA: Diagnosis not present

## 2018-05-18 DIAGNOSIS — Z3202 Encounter for pregnancy test, result negative: Secondary | ICD-10-CM | POA: Diagnosis not present

## 2018-05-18 LAB — POCT URINE PREGNANCY: PREG TEST UR: NEGATIVE

## 2018-05-18 MED ORDER — MEDROXYPROGESTERONE ACETATE 150 MG/ML IM SUSP
150.0000 mg | Freq: Once | INTRAMUSCULAR | Status: AC
  Administered 2018-05-18: 150 mg via INTRAMUSCULAR

## 2018-05-18 NOTE — Progress Notes (Signed)
Pt given DepoProvera 150mg IM left deltoid without complications. Advised to return in 12 weeks for next injection. 

## 2018-07-05 ENCOUNTER — Encounter (HOSPITAL_COMMUNITY): Payer: Self-pay | Admitting: Emergency Medicine

## 2018-07-05 ENCOUNTER — Other Ambulatory Visit: Payer: Self-pay

## 2018-07-05 ENCOUNTER — Emergency Department (HOSPITAL_COMMUNITY)
Admission: EM | Admit: 2018-07-05 | Discharge: 2018-07-05 | Disposition: A | Discharge status: Home / Self Care | Payer: BLUE CROSS/BLUE SHIELD | Attending: Emergency Medicine | Admitting: Emergency Medicine

## 2018-07-05 DIAGNOSIS — Z79899 Other long term (current) drug therapy: Secondary | ICD-10-CM | POA: Diagnosis not present

## 2018-07-05 DIAGNOSIS — I10 Essential (primary) hypertension: Secondary | ICD-10-CM | POA: Diagnosis not present

## 2018-07-05 DIAGNOSIS — B9789 Other viral agents as the cause of diseases classified elsewhere: Secondary | ICD-10-CM | POA: Diagnosis not present

## 2018-07-05 DIAGNOSIS — F17210 Nicotine dependence, cigarettes, uncomplicated: Secondary | ICD-10-CM | POA: Insufficient documentation

## 2018-07-05 DIAGNOSIS — J069 Acute upper respiratory infection, unspecified: Secondary | ICD-10-CM | POA: Diagnosis not present

## 2018-07-05 DIAGNOSIS — R05 Cough: Secondary | ICD-10-CM | POA: Diagnosis not present

## 2018-07-05 LAB — INFLUENZA PANEL BY PCR (TYPE A & B)
INFLAPCR: NEGATIVE
Influenza B By PCR: NEGATIVE

## 2018-07-05 MED ORDER — BENZONATATE 100 MG PO CAPS: 200.0000 mg | ORAL_CAPSULE | Freq: Three times a day (TID) | ORAL | 0 refills | Status: DC | PRN

## 2018-07-05 MED ORDER — BENZONATATE 100 MG PO CAPS
200.0000 mg | ORAL_CAPSULE | Freq: Once | ORAL | Status: AC
  Administered 2018-07-05: 200 mg via ORAL
  Filled 2018-07-05: qty 2

## 2018-07-05 NOTE — ED Provider Notes (Signed)
[facility] EMERGENCY DEPARTMENT Provider Note   CSN: [id number] Arrival date & time: 07/05/18  1111    History   Chief Complaint Chief Complaint  Patient presents with  . Influenza    HPI Suzanne Short is a 23 y.o. female presenting with a 1 day history of uri type symptoms which includes nasal congestion with clear rhinorrhea,  low grade fever to 100 and nonproductive cough.  Symptoms do not include shortness of breath, chest pain,  Nausea, vomiting or diarrhea.  The patient has taken DayQuil prior to arrival with transient improvement in symptoms. .     The history is provided by the patient.    Past Medical History:  Diagnosis Date  . Abnormal Pap smear of cervix 12/15/2016   LSIL, get colpo  . Allergic reaction    recurrent  . Bladder infection, chronic   . Contraceptive management 07/14/2015  . GERD (gastroesophageal reflux disease)   . Hidradenitis   . 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 Hypertension   . Marijuana use   . Renal disorder    chronic bladder infections  . Supervision of normal pregnancy in first trimester 05/03/2013  . Swelling   . Vaginal Pap smear, abnormal     Patient Active Problem List   Diagnosis Date Noted  . Screening examination for STD (sexually transmitted disease) 02/22/2018  . Encounter for gynecological examination with Papanicolaou smear of cervix 02/22/2018  . Abnormal Pap smear of cervix 12/15/2016  . History of abnormal cervical Pap smear 12/13/2016  . Encounter for surveillance of injectable contraceptive 12/13/2016  . 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 Contraceptive management 07/14/2015  . Angioedema 07/07/2015  . GERD without esophagitis 07/07/2015  . Urticaria 07/07/2015  . Family planning 09/18/2014  . Hypoactive sexual desire 08/06/2014  . Mood changes 08/06/2014  . Hidradenitis 04/23/2014  . Intertrigo labialis 10/30/2013  . Severe obesity (BMI >= 40) (HCC) 06/26/2013  . Marijuana use 06/24/2013    Past Surgical  History:  Procedure Laterality Date  . TONSILLECTOMY       OB History    Gravida  1   Para  1   Term  1   Preterm  0   AB  0   Living  1     SAB  0   TAB  0   Ectopic  0   Multiple  0   Live Births  1            Home Medications    Prior to Admission medications   Medication Sig Start Date End Date Taking? Authorizing Provider  benzonatate (TESSALON) 100 MG capsule Take 2 capsules (200 mg total) by mouth 3 (three) times daily as needed. 07/05/18   Burgess Amor, PA-C  diphenhydrAMINE (BENADRYL) 25 mg capsule Take 2 capsules (50 mg total) by mouth every 6 (six) hours as needed. Allergies 07/08/15   Erick Blinks, MD  EPINEPHrine (EPI-PEN) 0.3 mg/0.3 mL DEVI Inject 0.3 mLs (0.3 mg total) into the muscle once. 07/08/15   Erick Blinks, MD  famotidine (PEPCID) 20 MG tablet Take 1 tablet (20 mg total) by mouth 2 (two) times daily. 07/08/15   Erick Blinks, MD  medroxyPROGESTERone (DEPO-PROVERA) 150 MG/ML injection Inject 1 mL (150 mg total) into the muscle every 3 (three) months. 02/22/18   Adline Potter, NP  silver sulfADIAZINE (SILVADENE) 1 % cream Use to area 3-4 times per day 02/22/18   Adline Potter, NP    Family History Family History  Problem  Relation Age of Onset  . Diabetes Paternal Grandfather   . Heart disease Paternal Grandfather   . Diabetes Paternal Grandmother   . Heart disease Paternal Grandmother   . Diabetes Maternal Grandmother   . Heart disease Maternal Grandmother   . Diabetes Maternal Grandfather   . Heart disease Maternal Grandfather   . Heart disease Mother     Social History Social History   Tobacco Use  . Smoking status: Current Every Day Smoker    Packs/day: 1.50    Years: 2.00    Pack years: 3.00    Types: Cigarettes  . Smokeless tobacco: Former NeurosurgeonUser    Types: Chew  Substance Use Topics  . Alcohol use: Yes    Alcohol/week: 0.0 standard drinks    Comment: occ  . Drug use: No     Allergies   Nsaids;  Aleve [naproxen sodium]; Amoxicillin; and Tylenol [acetaminophen]   Review of Systems Review of Systems  Constitutional: Positive for chills and fever.  HENT: Positive for congestion and rhinorrhea. Negative for ear pain, sinus pressure, sore throat, trouble swallowing and voice change.   Eyes: Negative for discharge.  Respiratory: Positive for cough. Negative for shortness of breath, wheezing and stridor.   Cardiovascular: Negative for chest pain.  Gastrointestinal: Negative for abdominal pain.  Genitourinary: Negative.      Physical Exam Updated Vital Signs BP 120/74 (BP Location: Right Arm)   Pulse 93   Temp 98.3 F (36.8 C) (Oral)   Resp 17   Ht 5\' 9"  (1.753 m)   Wt 120.2 kg   SpO2 99%   BMI 39.13 kg/m   Physical Exam Vitals signs and nursing note reviewed.  Constitutional:      Appearance: She is well-developed.  HENT:     Head: Normocephalic and atraumatic.     Right Ear: Tympanic membrane and ear canal normal.     Left Ear: Tympanic membrane and ear canal normal.     Nose: Mucosal edema, congestion and rhinorrhea present.     Mouth/Throat:     Pharynx: Uvula midline. No oropharyngeal exudate or posterior oropharyngeal erythema.     Tonsils: No tonsillar abscesses.  Eyes:     Conjunctiva/sclera: Conjunctivae normal.  Cardiovascular:     Rate and Rhythm: Normal rate.     Heart sounds: Normal heart sounds.  Pulmonary:     Effort: Pulmonary effort is normal. No respiratory distress.     Breath sounds: No wheezing or rales.  Musculoskeletal: Normal range of motion.  Skin:    General: Skin is warm and dry.     Findings: No rash.  Neurological:     Mental Status: She is alert and oriented to person, place, and time.      ED Treatments / Results  Labs (all labs ordered are listed, but only abnormal results are displayed) Labs Reviewed  INFLUENZA PANEL BY PCR (TYPE A & B)    EKG None  Radiology No results found.  Procedures Procedures (including  critical care time)  Medications Ordered in ED Medications  benzonatate (TESSALON) capsule 200 mg (200 mg Oral Given 07/05/18 1531)     Initial Impression / Assessment and Plan / ED Course  I have reviewed the triage vital signs and the nursing notes.  Pertinent labs & imaging results that were available during my care of the patient were reviewed by me and considered in my medical decision making (see chart for details).        Patient with negative  influenza screen today and symptoms suggesting a viral URI.  We discussed home treatment therapy for improvement in symptoms as this runs its course.  She was prescribed Tessalon for her cough symptom.  PRN follow-up anticipated.  Lungs are clear.  Final Clinical Impressions(s) / ED Diagnoses   Final diagnoses:  Viral upper respiratory tract infection    ED Discharge Orders         Ordered    benzonatate (TESSALON) 100 MG capsule  3 times daily PRN     07/05/18 1505           Burgess Amor, PA-C 07/05/18 1618    Bethann Berkshire, MD 07/06/18 1458

## 2018-07-05 NOTE — Discharge Instructions (Addendum)
Rest and make sure you are drinking plenty of fluids.  It is okay to continue using your DayQuil.  You may find that [name] is also an excellent cough suppressant.

## 2018-07-05 NOTE — ED Triage Notes (Signed)
Cough and congestion that started yesterday. Fever started today.

## 2018-08-09 ENCOUNTER — Ambulatory Visit (INDEPENDENT_AMBULATORY_CARE_PROVIDER_SITE_OTHER): Payer: BLUE CROSS/BLUE SHIELD

## 2018-08-09 ENCOUNTER — Other Ambulatory Visit: Payer: Self-pay

## 2018-08-09 VITALS — Ht 69.0 in | Wt 272.0 lb

## 2018-08-09 DIAGNOSIS — Z3042 Encounter for surveillance of injectable contraceptive: Secondary | ICD-10-CM | POA: Diagnosis not present

## 2018-08-09 MED ORDER — MEDROXYPROGESTERONE ACETATE 150 MG/ML IM SUSP
150.0000 mg | Freq: Once | INTRAMUSCULAR | Status: AC
  Administered 2018-08-09: 150 mg via INTRAMUSCULAR

## 2018-08-09 NOTE — Progress Notes (Signed)
Pt here for depo injection 150 mg IM given right deltoid. Tolerated well. Return 12 weeks for next injection . Pad CMA

## 2018-08-10 ENCOUNTER — Ambulatory Visit: Payer: Medicaid Other

## 2018-11-01 ENCOUNTER — Ambulatory Visit (INDEPENDENT_AMBULATORY_CARE_PROVIDER_SITE_OTHER): Payer: BC Managed Care – PPO

## 2018-11-01 DIAGNOSIS — Z3042 Encounter for surveillance of injectable contraceptive: Secondary | ICD-10-CM | POA: Diagnosis not present

## 2018-11-01 MED ORDER — MEDROXYPROGESTERONE ACETATE 150 MG/ML IM SUSP
150.0000 mg | Freq: Once | INTRAMUSCULAR | Status: AC
  Administered 2018-11-01: 150 mg via INTRAMUSCULAR

## 2018-11-01 NOTE — Progress Notes (Signed)
Pt here for depo injection 150 mg IM given  Rt deltoid. Tolerated well. Return 12 weeks for next injection. Pad CMA 

## 2019-02-28 ENCOUNTER — Other Ambulatory Visit: Payer: Self-pay

## 2019-02-28 ENCOUNTER — Other Ambulatory Visit (HOSPITAL_COMMUNITY)
Admission: RE | Admit: 2019-02-28 | Discharge: 2019-02-28 | Disposition: A | Discharge status: Home / Self Care | Payer: BC Managed Care – PPO | Source: Ambulatory Visit | Attending: Adult Health | Admitting: Adult Health

## 2019-02-28 ENCOUNTER — Ambulatory Visit (INDEPENDENT_AMBULATORY_CARE_PROVIDER_SITE_OTHER): Payer: BC Managed Care – PPO | Admitting: Adult Health

## 2019-02-28 ENCOUNTER — Encounter: Payer: Self-pay | Admitting: Adult Health

## 2019-02-28 VITALS — BP 116/68 | HR 84 | Ht 67.5 in | Wt 250.0 lb

## 2019-02-28 DIAGNOSIS — Z113 Encounter for screening for infections with a predominantly sexual mode of transmission: Secondary | ICD-10-CM

## 2019-02-28 DIAGNOSIS — Z01419 Encounter for gynecological examination (general) (routine) without abnormal findings: Secondary | ICD-10-CM

## 2019-02-28 DIAGNOSIS — Z3009 Encounter for other general counseling and advice on contraception: Secondary | ICD-10-CM

## 2019-02-28 DIAGNOSIS — Z3042 Encounter for surveillance of injectable contraceptive: Secondary | ICD-10-CM

## 2019-02-28 DIAGNOSIS — R87612 Low grade squamous intraepithelial lesion on cytologic smear of cervix (LGSIL): Secondary | ICD-10-CM

## 2019-02-28 MED ORDER — MEDROXYPROGESTERONE ACETATE 150 MG/ML IM SUSP: 150.0000 mg | INTRAMUSCULAR | 3 refills | Status: DC

## 2019-02-28 NOTE — Progress Notes (Signed)
Patient ID: Suzanne Short, female   DOB: Oct 06, 1995, 23 y.o.   MRN: [id number] History of Present Illness:  Suzanne Short is a 23 year old white female,single, G1P1 in for a well woman gyn exam and pap.Her pap was LSIL with +HPV on 02/22/18.She had normal colpo, 03/30/18. 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  PCP is Dr Hilma Favors   Current Medications, Allergies, Past Medical History, Past Surgical History, Family History and Social History were reviewed in [facility] record.     Review of Systems: Patient denies any headaches, hearing loss, fatigue, blurred vision, shortness of breath, chest pain, abdominal pain, problems with bowel movements, urination, or intercourse. No joint pain or mood swings. Happy with depo, no periods.    Physical Exam:BP 116/68 (BP Location: Left Arm, Patient Position: Sitting, Cuff Size: Large)   Pulse 84   Ht 5' 7.5" (1.715 m)   Wt 250 lb (113.4 kg)   BMI 38.58 kg/m  General:  Well developed, well nourished, no acute distress Skin:  Warm and dry,multiple tattoos Neck:  Midline trachea, normal thyroid, good ROM, no lymphadenopathy Lungs; Clear to auscultation bilaterally Breast:  No dominant palpable mass, retraction, or nipple discharge Cardiovascular: Regular rate and rhythm Abdomen:  Soft, non tender, no hepatosplenomegaly Pelvic:  External genitalia is normal in appearance, has scarring from hidradenitis.    The vagina is normal in appearance. Urethra has no lesions or masses. The cervix is bulbous.Pap with GC/CHL and high risk HPV performed with 16/18 genotyping.   Uterus is felt to be normal size, shape, and contour.  No adnexal masses or tenderness noted.Bladder is non tender, no masses felt. Extremities/musculoskeletal:  No swelling or varicosities noted, no clubbing or cyanosis Psych:  No mood changes, alert and cooperative,seems happy Fall risk is low PHQ 2 score 0. Co-Examination by Weyman Croon FNP student, chaperoned by  me.   Impression and Plan: 1. Encounter for gynecological examination with Papanicolaou smear of cervix -pap sent -physical in 1 year Pap in 3 if normal   2. Family planning   3. Screening examination for STD (sexually transmitted disease) -check HIV and RPR  4. Low grade squamous intraepithelial lesion on cytologic smear of cervix (LGSIL)   5. Encounter for surveillance of injectable contraceptive Meds ordered this encounter  Medications  . medroxyPROGESTERone (DEPO-PROVERA) 150 MG/ML injection    Sig: Inject 1 mL (150 mg total) into the muscle every 3 (three) months.    Dispense:  1 mL    Refill:  3    Order Specific Question:   Supervising Provider    Answer:   Tania Ade H [[id number]]

## 2019-03-08 LAB — CYTOLOGY - PAP
Chlamydia: NEGATIVE
Comment: NEGATIVE
Comment: NEGATIVE
Comment: NEGATIVE
Comment: NEGATIVE
Comment: NORMAL
Diagnosis: NEGATIVE
HPV 16: NEGATIVE
HPV 18 / 45: NEGATIVE
High risk HPV: POSITIVE — AB
Neisseria Gonorrhea: NEGATIVE

## 2019-03-12 ENCOUNTER — Other Ambulatory Visit: Payer: Self-pay

## 2019-03-12 ENCOUNTER — Other Ambulatory Visit: Payer: BC Managed Care – PPO

## 2019-03-12 ENCOUNTER — Ambulatory Visit (INDEPENDENT_AMBULATORY_CARE_PROVIDER_SITE_OTHER): Payer: BC Managed Care – PPO

## 2019-03-12 DIAGNOSIS — Z3042 Encounter for surveillance of injectable contraceptive: Secondary | ICD-10-CM

## 2019-03-12 LAB — BETA HCG QUANT (REF LAB): hCG Quant: <1 m[IU]/mL

## 2019-03-12 MED ORDER — MEDROXYPROGESTERONE ACETATE 150 MG/ML IM SUSP
150.0000 mg | Freq: Once | INTRAMUSCULAR | Status: AC
  Administered 2019-03-12: 150 mg via INTRAMUSCULAR

## 2019-03-12 NOTE — Progress Notes (Signed)
   NURSE VISIT- INJECTION  SUBJECTIVE:  Suzanne Short is a 23 y.o. G83P1001 female here for a Depo Provera for contraception/period management. She is a GYN patient.   OBJECTIVE:  There were no vitals taken for this visit.  Appears well, in no apparent distress  Injection administered in: Left deltoid  Meds ordered this encounter  Medications  . medroxyPROGESTERone (DEPO-PROVERA) injection 150 mg    ASSESSMENT: GYN patient Depo Provera for contraception/period management  PLAN: Follow-up: in 11-13 weeks for next Depo   Ladonna Snide  03/12/2019 3:31 PM

## 2019-06-04 ENCOUNTER — Ambulatory Visit (INDEPENDENT_AMBULATORY_CARE_PROVIDER_SITE_OTHER): Payer: BC Managed Care – PPO | Admitting: *Deleted

## 2019-06-04 ENCOUNTER — Other Ambulatory Visit: Payer: Self-pay

## 2019-06-04 DIAGNOSIS — Z3042 Encounter for surveillance of injectable contraceptive: Secondary | ICD-10-CM

## 2019-06-04 MED ORDER — MEDROXYPROGESTERONE ACETATE 150 MG/ML IM SUSP
150.0000 mg | Freq: Once | INTRAMUSCULAR | Status: AC
  Administered 2019-06-04: 16:00:00 150 mg via INTRAMUSCULAR

## 2019-06-04 NOTE — Progress Notes (Signed)
   NURSE VISIT- INJECTION  SUBJECTIVE:  Suzanne Short is a 24 y.o. G21P1001 female here for a Depo Provera for contraception/period management. She is a GYN patient.   OBJECTIVE:  There were no vitals taken for this visit.  Appears well, in no apparent distress  Injection administered in: Right deltoid  No orders of the defined types were placed in this encounter.   ASSESSMENT: GYN patient Depo Provera for contraception/period management PLAN: Follow-up: in 11-13 weeks for next Depo   Rocko Fesperman, Faith Rogue  06/04/2019 4:10 PM

## 2019-07-02 IMAGING — CT CT ABD-PELV W/ CM
2 of 4 series · 16 of 46 positions shown, 18 images · IV contrast (Isovue)
Comparison: None.

CLINICAL DATA: Nausea and vomiting since [REDACTED]. Epigastric
intermittent pain and diarrhea.

EXAM:
CT ABDOMEN AND PELVIS WITH CONTRAST
TECHNIQUE: Multidetector CT imaging of the abdomen and pelvis was performed
using the standard protocol following bolus administration of
intravenous contrast.
CONTRAST:  100mL 1807K6-TXX IOPAMIDOL (1807K6-TXX) INJECTION 61%

[Series 2: axial st · axial · 0.84mm/px · z∈[+1036,+1486]mm · 13 of 100 slices shown, 15 images]
[im 5/100  soft-tissue]
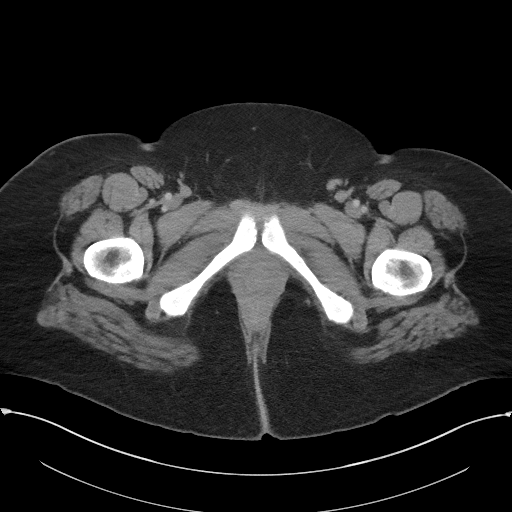
[im 5/100  bone]
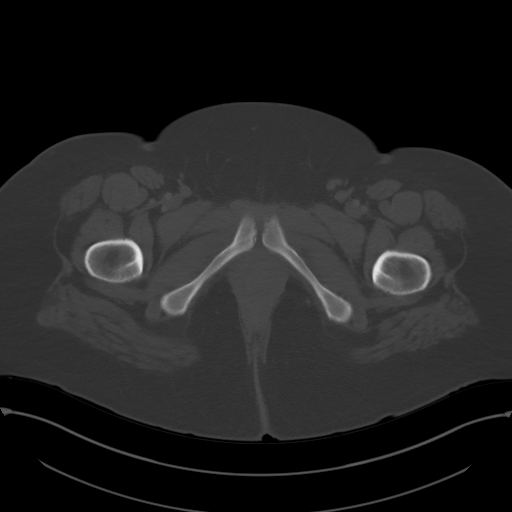
[im 13/100  soft-tissue]
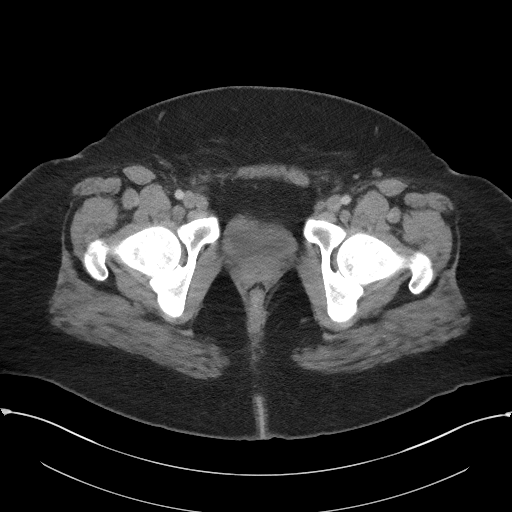
[im 22/100  soft-tissue]
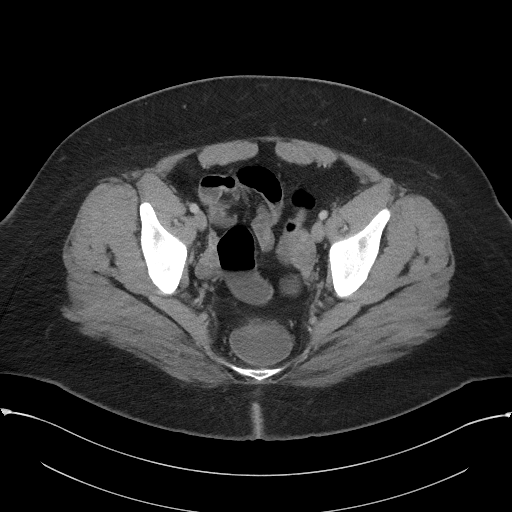
[im 26/100  soft-tissue]
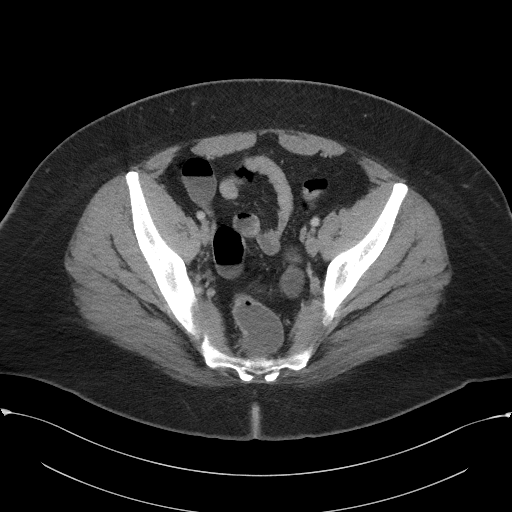
[im 35/100  soft-tissue]
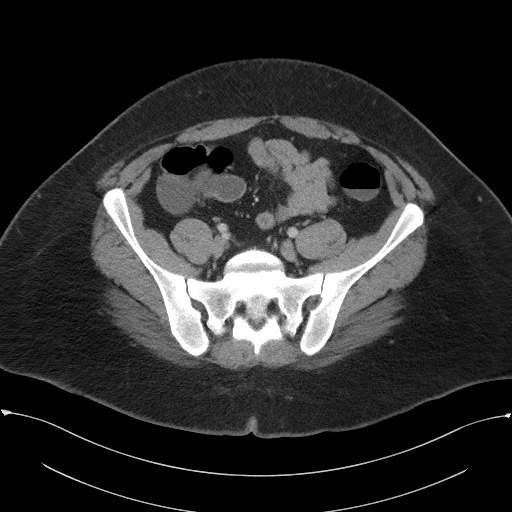
[im 44/100  soft-tissue]
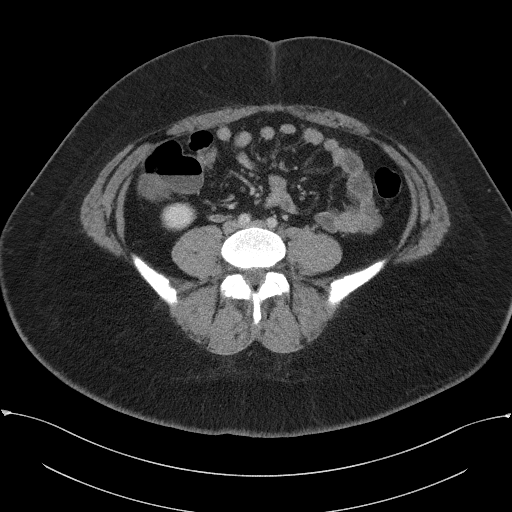
[im 52/100  soft-tissue]
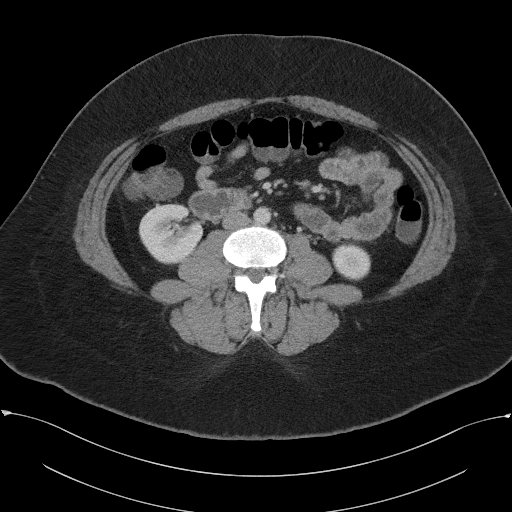
[im 56/100  soft-tissue]
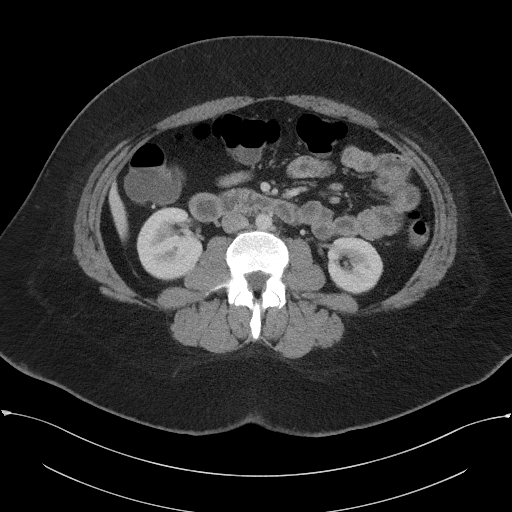
[im 65/100  soft-tissue]
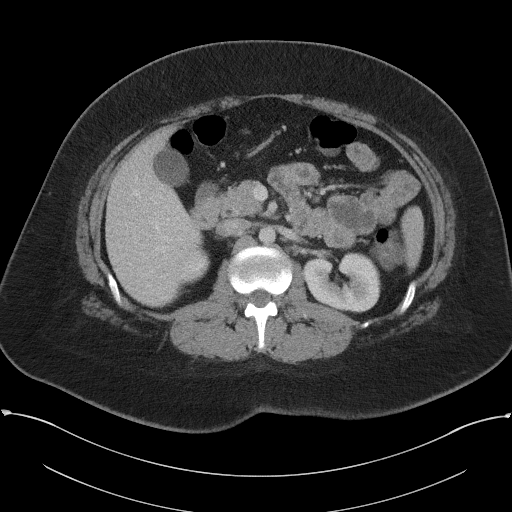
[im 65/100  bone]
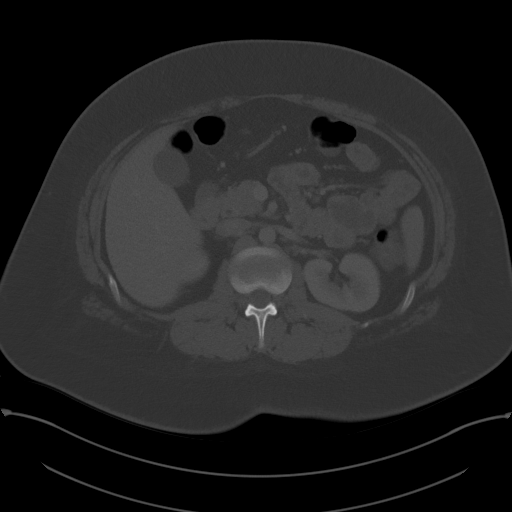
[im 74/100  soft-tissue]
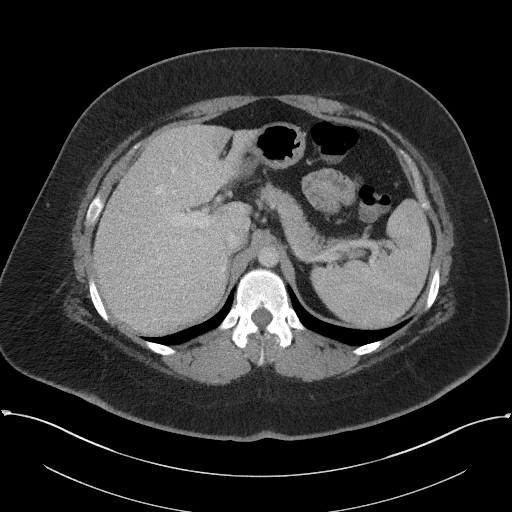
[im 78/100  soft-tissue]
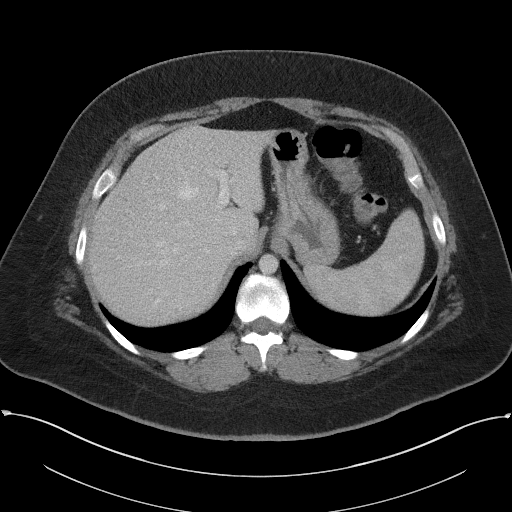
[im 87/100  soft-tissue]
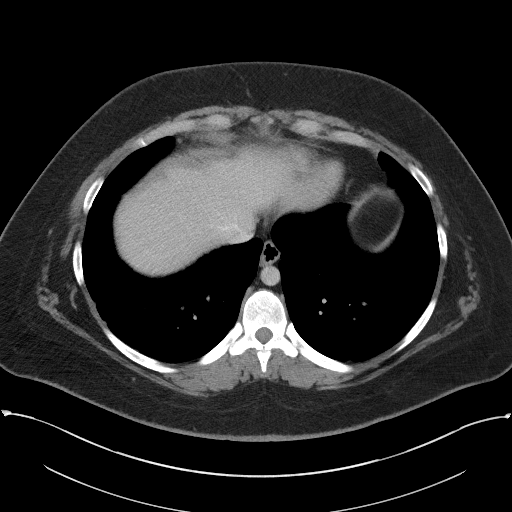
[im 95/100  soft-tissue]
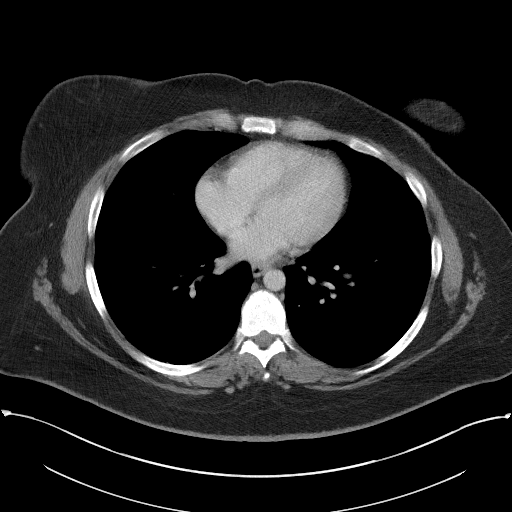

[Series 5: coronal st · coronal · 0.97mm/px · 3 of 127 slices shown]
[im 43/127  soft-tissue]
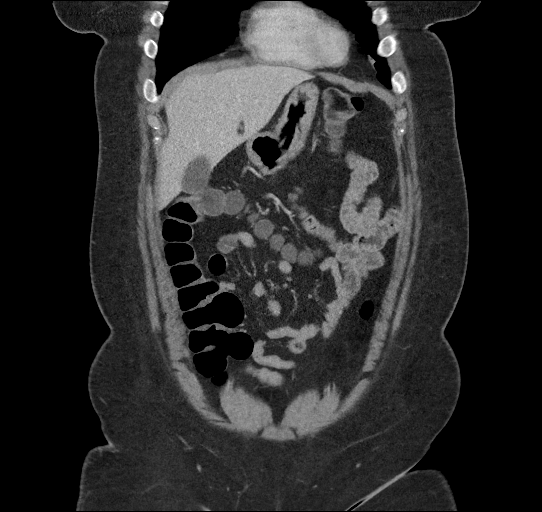
[im 57/127  soft-tissue]
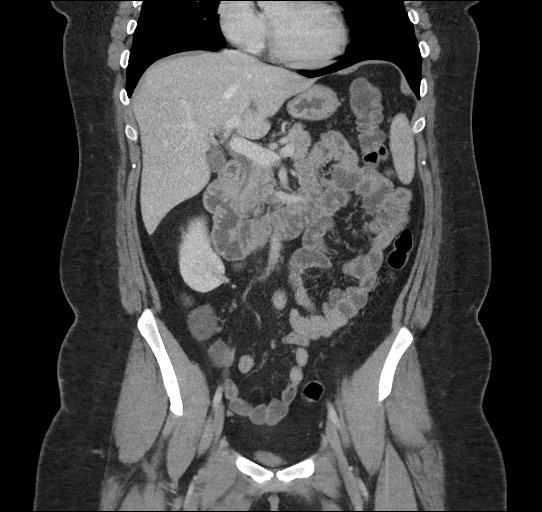
[im 71/127  soft-tissue]
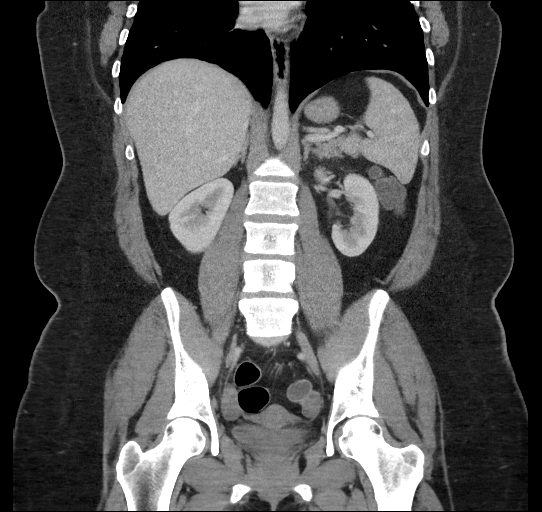

[16 of 46 positions shown; findings below may reference images not displayed]

FINDINGS: Lower chest: Minimal right basilar atelectasis. Normal sized
included heart without pericardial effusion.

Hepatobiliary: No focal liver abnormality is seen. No gallstones,
gallbladder wall thickening, or biliary dilatation.

Pancreas: Unremarkable. No pancreatic ductal dilatation or
surrounding inflammatory changes.

Spleen: Normal in size without focal abnormality.

Adrenals/Urinary Tract: Adrenal glands are unremarkable. Kidneys are
normal, without renal calculi, focal lesion, or hydronephrosis.
Bladder is decompressed in appearance..

Stomach/Bowel: Decompressed stomach. Mild diffuse transmural
thickening and fluid-filled distention of duodenum and jejunum
without mechanical bowel obstruction. Liquid stool noted within the
colon to the level of the rectum without mural thickening.
Unremarkable diminutive appendix.

Vascular/Lymphatic: No significant vascular findings are present. No
enlarged abdominal or pelvic lymph nodes.

Reproductive: Uterus and bilateral adnexa are unremarkable.

Other: No abdominal wall hernia or abnormality. No abdominopelvic
ascites.

Musculoskeletal: No acute or significant osseous findings.
IMPRESSION: 1. Mild diffuse transmural thickening of small bowel with
fluid-filled distention compatible with small bowel enteritis.
2. Liquid stool within the colon without mural thickening or
inflammation compatible with diarrheal disease.

## 2019-08-22 ENCOUNTER — Ambulatory Visit (INDEPENDENT_AMBULATORY_CARE_PROVIDER_SITE_OTHER): Payer: BC Managed Care – PPO | Admitting: *Deleted

## 2019-08-22 ENCOUNTER — Encounter: Payer: Self-pay | Admitting: *Deleted

## 2019-08-22 ENCOUNTER — Other Ambulatory Visit: Payer: Self-pay

## 2019-08-22 DIAGNOSIS — Z3042 Encounter for surveillance of injectable contraceptive: Secondary | ICD-10-CM

## 2019-08-22 MED ORDER — MEDROXYPROGESTERONE ACETATE 150 MG/ML IM SUSP
150.0000 mg | Freq: Once | INTRAMUSCULAR | Status: AC
Start: 2019-08-22 — End: 2019-08-22
  Administered 2019-08-22: 16:00:00 150 mg via INTRAMUSCULAR

## 2019-08-22 NOTE — Progress Notes (Signed)
   NURSE VISIT- INJECTION  SUBJECTIVE:  Suzanne Short is a 24 y.o. G66P1001 female here for a Depo Provera for contraception/period management. She is a GYN patient.   OBJECTIVE:  There were no vitals taken for this visit.  Appears well, in no apparent distress  Injection administered in: Right deltoid  Meds ordered this encounter  Medications  . medroxyPROGESTERone (DEPO-PROVERA) injection 150 mg    ASSESSMENT: GYN patient Depo Provera for contraception/period management PLAN: Follow-up: in 11-13 weeks for next Depo   Jobe Marker  08/22/2019 4:14 PM

## 2019-09-25 ENCOUNTER — Other Ambulatory Visit: Payer: BC Managed Care – PPO

## 2019-11-08 ENCOUNTER — Ambulatory Visit: Payer: BC Managed Care – PPO

## 2019-11-11 ENCOUNTER — Other Ambulatory Visit: Payer: Self-pay

## 2019-11-11 ENCOUNTER — Encounter: Payer: Self-pay | Admitting: *Deleted

## 2019-11-11 ENCOUNTER — Ambulatory Visit (INDEPENDENT_AMBULATORY_CARE_PROVIDER_SITE_OTHER): Payer: BC Managed Care – PPO | Admitting: *Deleted

## 2019-11-11 DIAGNOSIS — Z3042 Encounter for surveillance of injectable contraceptive: Secondary | ICD-10-CM | POA: Diagnosis not present

## 2019-11-11 DIAGNOSIS — Z308 Encounter for other contraceptive management: Secondary | ICD-10-CM

## 2019-11-11 MED ORDER — MEDROXYPROGESTERONE ACETATE 150 MG/ML IM SUSP
150.0000 mg | Freq: Once | INTRAMUSCULAR | Status: AC
  Administered 2019-11-11: 150 mg via INTRAMUSCULAR

## 2019-11-11 NOTE — Progress Notes (Signed)
   NURSE VISIT- INJECTION  SUBJECTIVE:  Suzanne Short is a 24 y.o. G46P1001 female here for a Depo Provera for contraception/period management. She is a GYN patient.   OBJECTIVE:  There were no vitals taken for this visit.  Appears well, in no apparent distress  Injection administered in: Left deltoid  Meds ordered this encounter  Medications  . medroxyPROGESTERone (DEPO-PROVERA) injection 150 mg    ASSESSMENT: GYN patient Depo Provera for contraception/period management PLAN: Follow-up: in 11-13 weeks for next Depo   Malachy Mood  11/11/2019 3:35 PM

## 2020-01-16 ENCOUNTER — Other Ambulatory Visit: Payer: Self-pay

## 2020-01-16 ENCOUNTER — Other Ambulatory Visit: Payer: Medicaid Other

## 2020-01-16 DIAGNOSIS — Z20822 Contact with and (suspected) exposure to covid-19: Secondary | ICD-10-CM

## 2020-01-17 LAB — NOVEL CORONAVIRUS, NAA: SARS-CoV-2, NAA: NOT DETECTED

## 2020-01-30 ENCOUNTER — Other Ambulatory Visit: Payer: Self-pay | Admitting: Adult Health

## 2020-02-03 ENCOUNTER — Encounter: Payer: Self-pay | Admitting: *Deleted

## 2020-02-03 ENCOUNTER — Ambulatory Visit (INDEPENDENT_AMBULATORY_CARE_PROVIDER_SITE_OTHER): Payer: Medicaid Other | Admitting: *Deleted

## 2020-02-03 DIAGNOSIS — Z3042 Encounter for surveillance of injectable contraceptive: Secondary | ICD-10-CM | POA: Diagnosis not present

## 2020-02-03 MED ORDER — MEDROXYPROGESTERONE ACETATE 150 MG/ML IM SUSP
150.0000 mg | Freq: Once | INTRAMUSCULAR | Status: AC
  Administered 2020-02-03: 150 mg via INTRAMUSCULAR

## 2020-02-03 NOTE — Progress Notes (Signed)
   NURSE VISIT- INJECTION  SUBJECTIVE:  Suzanne Short is a 24 y.o. G39P1001 female here for a Depo Provera for contraception/period management. She is a GYN patient.   OBJECTIVE:  There were no vitals taken for this visit.  Appears well, in no apparent distress  Injection administered in: Right deltoid  No orders of the defined types were placed in this encounter.   ASSESSMENT: GYN patient Depo Provera for contraception/period management PLAN: Follow-up: in 11-13 weeks for next Depo   Nance Pear  02/03/2020 3:22 PM

## 2020-03-02 ENCOUNTER — Other Ambulatory Visit: Payer: BC Managed Care – PPO | Admitting: Adult Health

## 2020-03-13 ENCOUNTER — Other Ambulatory Visit (HOSPITAL_COMMUNITY)
Admission: RE | Admit: 2020-03-13 | Discharge: 2020-03-13 | Disposition: A | Discharge status: Home / Self Care | Payer: Medicaid Other | Source: Ambulatory Visit | Attending: Adult Health | Admitting: Adult Health

## 2020-03-13 ENCOUNTER — Other Ambulatory Visit: Payer: Self-pay

## 2020-03-13 ENCOUNTER — Ambulatory Visit (INDEPENDENT_AMBULATORY_CARE_PROVIDER_SITE_OTHER): Payer: Medicaid Other | Admitting: Women's Health

## 2020-03-13 ENCOUNTER — Encounter: Payer: Self-pay | Admitting: Women's Health

## 2020-03-13 VITALS — BP 124/72 | HR 85 | Ht 71.0 in | Wt 299.4 lb

## 2020-03-13 DIAGNOSIS — Z01419 Encounter for gynecological examination (general) (routine) without abnormal findings: Secondary | ICD-10-CM

## 2020-03-13 DIAGNOSIS — Z3009 Encounter for other general counseling and advice on contraception: Secondary | ICD-10-CM

## 2020-03-13 DIAGNOSIS — Z113 Encounter for screening for infections with a predominantly sexual mode of transmission: Secondary | ICD-10-CM

## 2020-03-13 MED ORDER — CLINDAMYCIN PHOSPHATE 1 % EX GEL: Freq: Two times a day (BID) | CUTANEOUS | 11 refills | Status: DC

## 2020-03-13 NOTE — Patient Instructions (Signed)
Hidradenitis . Loose, light clothing. Avoid heat, friction, shearing. Don't squeeze! . Wash clothes in perfume/dye free detergent . Gentle non-soap cleanser, wash gently w/ fingers (No cloth, loofah), can use antibacterial cleanser . Stop smoking! . Weight loss . Decrease/no dairy  Hidradenitis Suppurativa Hidradenitis suppurativa is a long-term (chronic) skin disease that starts with blocked sweat glands or hair follicles. Bacteria may grow in these blocked openings of your skin. Hidradenitis suppurativa is like a severe form of acne that develops in areas of your body where acne would be unusual. It is most likely to affect the areas of your body where skin rubs against skin and becomes moist. This includes your:  Underarms.  Groin.  Genital areas.  Buttocks.  Upper thighs.  Breasts. Hidradenitis suppurativa may start out with small pimples. The pimples can develop into deep sores that break open (rupture) and drain pus. Over time your skin may thicken and become scarred. Hidradenitis suppurativa cannot be passed from person to person. What are the causes? The exact cause of hidradenitis suppurativa is not known. This condition may be due to:  Female and female hormones. The condition is rare before and after puberty.  An overactive body defense system (immune system). Your immune system may overreact to the blocked hair follicles or sweat glands and cause swelling and pus-filled sores. What increases the risk? You may have a higher risk of hidradenitis suppurativa if you:  Are a woman.  Are between ages 11 and 55.  Have a family history of hidradenitis suppurativa.  Have a personal history of acne.  Are overweight.  Smoke.  Take the drug lithium. What are the signs or symptoms? The first signs of an outbreak are usually painful skin bumps that look like pimples. As the condition progresses:  Skin bumps may get bigger and grow deeper into the skin.  Bumps under the  skin may rupture and drain smelly pus.  Skin may become itchy and infected.  Skin may thicken and scar.  Drainage may continue through tunnels under the skin (fistulas).  Walking and moving your arms can become painful. How is this diagnosed? Your health care provider may diagnose hidradenitis suppurativa based on your medical history and your signs and symptoms. A physical exam will also be done. You may need to see a health care provider who specializes in skin diseases (dermatologist). You may also have tests done to confirm the diagnosis. These can include:  Swabbing a sample of pus or drainage from your skin so it can be sent to the lab and tested for infection.  Blood tests to check for infection. How is this treated? The same treatment will not work for everybody with hidradenitis suppurativa. Your treatment will depend on how severe your symptoms are. You may need to try several treatments to find what works best for you. Part of your treatment may include cleaning and bandaging (dressing) your wounds. You may also have to take medicines, such as the following:  Antibiotics.  Acne medicines.  Medicines to block or suppress the immune system.  A diabetes medicine (metformin) is sometimes used to treat this condition.  For women, birth control pills can sometimes help relieve symptoms. You may need surgery if you have a severe case of hidradenitis suppurativa that does not respond to medicine. Surgery may involve:  Using a laser to clear the skin and remove hair follicles.  Opening and draining deep sores.  Removing the areas of skin that are diseased and scarred. Follow these instructions   at home:  Learn as much as you can about your disease, and work closely with your health care providers.  Take medicines only as directed by your health care provider.  If you were prescribed an antibiotic medicine, finish it all even if you start to feel better.  If you are  overweight, losing weight may be very helpful. Try to reach and maintain a healthy weight.  Do not use any tobacco products, including cigarettes, chewing tobacco, or electronic cigarettes. If you need help quitting, ask your health care provider.  Do not shave the areas where you get hidradenitis suppurativa.  Do not wear deodorant.  Wear loose-fitting clothes.  Try not to overheat and get sweaty.  Take a daily bleach bath as directed by your health care provider.  Fill your bathtub halfway with water.  Pour in  cup of unscented household bleach.  Soak for 5-10 minutes.  Cover sore areas with a warm, clean washcloth (compress) for 5-10 minutes. Contact a health care provider if:  You have a flare-up of hidradenitis suppurativa.  You have chills or a fever.  You are having trouble controlling your symptoms at home. This information is not intended to replace advice given to you by your health care provider. Make sure you discuss any questions you have with your health care provider. Document Released: 12/15/2003 Document Revised: 10/08/2015 Document Reviewed: 08/02/2013 Elsevier Interactive Patient Education  2017 [employer]    

## 2020-03-13 NOTE — Progress Notes (Signed)
WELL-WOMAN EXAMINATION Patient name: Suzanne Short MRN [id number]  Date of birth: 10/26/1995 Chief Complaint:   Gynecologic Exam  History of Present Illness:   Suzanne Short is a 24 y.o. G66P1001 Caucasian female being seen today for a routine FP Mcaid well-woman exam.  Current complaints: silvadene cream makes hidradenitis boils burn, wants something else  Depression screen [facility] 2/9 03/13/2020 02/28/2019 02/22/2018 12/13/2016  Decreased Interest 1 0 0 1  Down, Depressed, Hopeless 1 0 0 0  PHQ - 2 Score 2 0 0 1  Altered sleeping 1 - - -  Tired, decreased energy 1 - - -  Change in appetite 0 - - -  Feeling bad or failure about yourself  1 - - -  Trouble concentrating 0 - - -  Moving slowly or fidgety/restless 0 - - -  Suicidal thoughts 0 - - -  PHQ-9 Score 5 - - -     PCP: none      No LMP recorded. Patient has had an injection. The current method of family planning is Depo-Provera injections.  Last pap 02/28/19. Results were: abnormal neg pap w/ +HRHPV. H/O abnormal pap: yes LSIL 2018 & 2019 Last mammogram: never. Results were: N/A. Family h/o breast cancer: no Last colonoscopy: never. Results were: N/A. Family h/o colorectal cancer: no Review of Systems:   Pertinent items are noted in HPI Denies any headaches, blurred vision, fatigue, shortness of breath, chest pain, abdominal pain, abnormal vaginal discharge/itching/odor/irritation, problems with periods, bowel movements, urination, or intercourse unless otherwise stated above. Pertinent History Reviewed:  Reviewed past medical,surgical, social and family history.  Reviewed problem list, medications and allergies. Physical Assessment:   Vitals:   03/13/20 1216  BP: 124/72  Pulse: 85  Weight: 299 lb 6.4 oz (135.8 kg)  Height: 5\' 11"  (1.803 m)  Body mass index is 41.76 kg/m.        Physical Examination:   General appearance - well appearing, and in no distress  Mental status - alert, oriented to person, place, and  time  Psych:  She has a normal mood and affect  Skin - warm and dry, normal color, multiple boils under axilla, breasts, groin c/w hidradenitis  Chest - effort normal, all lung fields clear to auscultation bilaterally  Heart - normal rate and regular rhythm  Neck:  midline trachea, no thyromegaly or nodules  Breasts - breasts appear normal, no suspicious masses, no skin or nipple changes or  axillary nodes  Abdomen - soft, nontender, nondistended, no masses or organomegaly  Pelvic - VULVA: normal appearing vulva with no masses, tenderness or lesions  VAGINA: normal appearing vagina with normal color and discharge, no lesions  CERVIX: normal appearing cervix without discharge or lesions, no CMT  Thin prep pap is done w/ HR HPV cotesting  UTERUS: uterus is felt to be normal size, shape, consistency and nontender   ADNEXA: No adnexal masses or tenderness noted.  Extremities:  No swelling or varicosities noted  Chaperone:    No results found for this or any previous visit (from the past 24 hour(s)).  Assessment & Plan:  1) FP Mcaid Well-Woman Exam  2) Hidradenitis> gave printed tips, rx clinda gel  3) H/O abnormal paps  Labs/procedures today: pap, labs  Mammogram @26yo  or sooner if problems Colonoscopy @24yo  or sooner if problems  Orders Placed This Encounter  Procedures  . HIV Antibody (routine testing w rflx)  . RPR    Meds:  Meds ordered this encounter  Medications  . clindamycin (CLINDAGEL) 1 % gel    Sig: Apply topically 2 (two) times daily.    Dispense:  30 g    Refill:  11    Order Specific Question:   Supervising Provider    Answer:   Lazaro Arms [[id number]]    Follow-up: Return in about 1 year (around 03/13/2021) for Physical; depo as scheduled.  Cheral Marker CNM, [facility] 03/13/2020 12:43 PM

## 2020-03-13 NOTE — Addendum Note (Signed)
Addended by: [name] on: 03/13/2020 01:05 PM   Modules accepted: Orders

## 2020-03-14 LAB — HIV ANTIBODY (ROUTINE TESTING W REFLEX): HIV Screen 4th Generation wRfx: NONREACTIVE

## 2020-03-14 LAB — RPR: RPR Ser Ql: NONREACTIVE

## 2020-03-19 LAB — CYTOLOGY - PAP
Chlamydia: NEGATIVE
Comment: NEGATIVE
Comment: NEGATIVE
Comment: NEGATIVE
Comment: NORMAL
Diagnosis: UNDETERMINED — AB
HPV 16: NEGATIVE
HPV 18 / 45: NEGATIVE
High risk HPV: POSITIVE — AB
Neisseria Gonorrhea: NEGATIVE

## 2020-03-23 ENCOUNTER — Telehealth: Payer: Self-pay

## 2020-03-23 NOTE — Telephone Encounter (Signed)
Pt called, no answer, left vm, waiting for return call.

## 2020-03-25 ENCOUNTER — Telehealth: Payer: Self-pay | Admitting: Women's Health

## 2020-03-25 NOTE — Telephone Encounter (Signed)
Left message to return call, need to schedule colpo with Dr. Despina Hidden per Genella Rife

## 2020-04-27 ENCOUNTER — Ambulatory Visit: Payer: Medicaid Other

## 2020-04-29 ENCOUNTER — Ambulatory Visit (INDEPENDENT_AMBULATORY_CARE_PROVIDER_SITE_OTHER): Payer: Medicaid Other | Admitting: *Deleted

## 2020-04-29 ENCOUNTER — Other Ambulatory Visit: Payer: Self-pay

## 2020-04-29 DIAGNOSIS — Z3042 Encounter for surveillance of injectable contraceptive: Secondary | ICD-10-CM

## 2020-04-29 MED ORDER — MEDROXYPROGESTERONE ACETATE 150 MG/ML IM SUSP
150.0000 mg | Freq: Once | INTRAMUSCULAR | Status: AC
  Administered 2020-04-29: 150 mg via INTRAMUSCULAR

## 2020-04-29 NOTE — Progress Notes (Signed)
° °  NURSE VISIT- INJECTION  SUBJECTIVE:  Suzanne Short is a 24 y.o. G104P1001 female here for a Depo Provera for contraception/period management. She is a GYN patient.   OBJECTIVE:  There were no vitals taken for this visit.  Appears well, in no apparent distress  Injection administered in: Left deltoid  No orders of the defined types were placed in this encounter.   ASSESSMENT: GYN patient Depo Provera for contraception/period management PLAN: Follow-up: in 11-13 weeks for next Depo   [name]  04/29/2020 10:48 AM

## 2020-07-22 ENCOUNTER — Other Ambulatory Visit: Payer: Self-pay

## 2020-07-22 ENCOUNTER — Ambulatory Visit (INDEPENDENT_AMBULATORY_CARE_PROVIDER_SITE_OTHER): Payer: Medicaid Other

## 2020-07-22 DIAGNOSIS — Z3042 Encounter for surveillance of injectable contraceptive: Secondary | ICD-10-CM

## 2020-07-22 MED ORDER — MEDROXYPROGESTERONE ACETATE 150 MG/ML IM SUSP
150.0000 mg | Freq: Once | INTRAMUSCULAR | Status: AC
  Administered 2020-07-22: 150 mg via INTRAMUSCULAR

## 2020-07-22 NOTE — Progress Notes (Signed)
   NURSE VISIT- INJECTION  SUBJECTIVE:  Suzanne Short is a 25 y.o. G57P1001 female here for a Depo Provera for contraception/period management. She is a GYN patient.   OBJECTIVE:  There were no vitals taken for this visit.  Appears well, in no apparent distress  Injection administered in: Right deltoid  Meds ordered this encounter  Medications  . medroxyPROGESTERone (DEPO-PROVERA) injection 150 mg    ASSESSMENT: GYN patient Depo Provera for contraception/period management PLAN: Follow-up: in 11-13 weeks for next Depo   Iris A Neas  07/22/2020 10:25 AM

## 2020-10-08 ENCOUNTER — Other Ambulatory Visit: Payer: Self-pay

## 2020-10-08 ENCOUNTER — Ambulatory Visit
Admission: EM | Admit: 2020-10-08 | Discharge: 2020-10-08 | Disposition: A | Discharge status: Home / Self Care | Payer: Medicaid Other | Attending: Emergency Medicine | Admitting: Emergency Medicine

## 2020-10-08 DIAGNOSIS — J069 Acute upper respiratory infection, unspecified: Secondary | ICD-10-CM | POA: Diagnosis not present

## 2020-10-08 DIAGNOSIS — R6889 Other general symptoms and signs: Secondary | ICD-10-CM

## 2020-10-08 MED ORDER — PREDNISONE 20 MG PO TABS: 20.0000 mg | ORAL_TABLET | Freq: Two times a day (BID) | ORAL | 0 refills | Status: AC

## 2020-10-08 MED ORDER — BENZONATATE 100 MG PO CAPS: 100.0000 mg | ORAL_CAPSULE | Freq: Three times a day (TID) | ORAL | 0 refills | Status: DC

## 2020-10-08 NOTE — ED Provider Notes (Signed)
[facility]   [id number] 10/08/20 Arrival Time: 5638   CC: COVID symptoms  SUBJECTIVE: History from: patient.  Suzanne Short is a 25 y.o. female who presents with abrupt onset of cough, chills, and diarrhea x 4 days.  Denies sick exposure to COVID, flu or strep.  Has tried OTC medications without relief.  Symptoms are made worse at night.  Reports previous symptoms in the past.   Denies fever, chills, SOB, chest pain, nausea, changes in bowel or bladder habits.     Complains of associated wheezing.    ROS: As per HPI.  All other pertinent ROS negative.     Past Medical History:  Diagnosis Date  . Abnormal Pap smear of cervix 12/15/2016   LSIL, get colpo  . Allergic reaction    recurrent  . Bladder infection, chronic   . Contraceptive management 07/14/2015  . GERD (gastroesophageal reflux disease)   . Hidradenitis   . 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 Hypertension   . Marijuana use   . Renal disorder    chronic bladder infections  . Supervision of normal pregnancy in first trimester 05/03/2013  . Swelling   . Vaginal Pap smear, abnormal    Past Surgical History:  Procedure Laterality Date  . TONSILLECTOMY     Allergies  Allergen Reactions  . Nsaids Swelling    Had angioedema and was admitted for 2 days at [facility]  . Aleve [Naproxen Sodium] Hives  . Amoxicillin Hives    Able to take Keflex.  . Tylenol [Acetaminophen] Hives   No current facility-administered medications on file prior to encounter.   Current Outpatient Medications on File Prior to Encounter  Medication Sig Dispense Refill  . medroxyPROGESTERone (DEPO-PROVERA) 150 MG/ML injection INJECT 1 ML INTO THE MUSCLE EVERY 3 MONTHS. 1 mL 3   Social History   Socioeconomic History  . Marital status: Media planner    Spouse name: Not on file  . Number of children: 1  . Years of education: Not on file  . Highest education level: Not on file  Occupational History  . Not on file  Tobacco Use  . Smoking  status: Current Every Day Smoker    Packs/day: 1.50    Years: 2.00    Pack years: 3.00    Types: Cigarettes  . Smokeless tobacco: Former Neurosurgeon    Types: Engineer, drilling  . Vaping Use: Never used  Substance and Sexual Activity  . Alcohol use: Yes    Alcohol/week: 0.0 standard drinks    Comment: occ  . Drug use: No  . Sexual activity: Yes    Birth control/protection: Injection  Other Topics Concern  . Not on file  Social History Narrative  . Not on file   Social Determinants of Health   Financial Resource Strain: Medium Risk  . Difficulty of Paying Living Expenses: Somewhat hard  Food Insecurity: Food Insecurity Present  . Worried About Programme researcher, broadcasting/film/video in the Last Year: Sometimes true  . Ran Out of Food in the Last Year: Sometimes true  Transportation Needs: No Transportation Needs  . Lack of Transportation (Medical): No  . Lack of Transportation (Non-Medical): No  Physical Activity: Insufficiently Active  . Days of Exercise per Week: 2 days  . Minutes of Exercise per Session: 30 min  Stress: No Stress Concern Present  . Feeling of Stress : Only a little  Social Connections: Moderately Isolated  . Frequency of Communication with Friends and Family: More than three times  a week  . Frequency of Social Gatherings with Friends and Family: Three times a week  . Attends Religious Services: Never  . Active Member of Clubs or Organizations: No  . Attends Banker Meetings: Never  . Marital Status: Living with partner  Intimate Partner Violence: Not At Risk  . Fear of Current or Ex-Partner: No  . Emotionally Abused: No  . Physically Abused: No  . Sexually Abused: No   Family History  Problem Relation Age of Onset  . Diabetes Paternal Grandfather   . Heart disease Paternal Grandfather   . Diabetes Paternal Grandmother   . Heart disease Paternal Grandmother   . Diabetes Maternal Grandmother   . Heart disease Maternal Grandmother   . Diabetes Maternal  Grandfather   . Heart disease Maternal Grandfather   . Heart disease Mother   . Heart attack Father     OBJECTIVE:  Vitals:   10/08/20 0834 10/08/20 0835  BP:  118/73  Pulse:  78  Resp:  16  Temp:  98.2 F (36.8 C)  TempSrc:  Oral  SpO2:  95%  Weight: 240 lb (108.9 kg)      General appearance: alert; appears fatigued, but nontoxic; speaking in full sentences and tolerating own secretions HEENT: NCAT; Ears: EACs clear, TMs pearly gray; Eyes: PERRL.  EOM grossly intact. Nose: nares patent without rhinorrhea, Throat: oropharynx clear, tonsils non erythematous or enlarged, uvula midline  Neck: supple without LAD Lungs: unlabored respirations, symmetrical air entry; cough: absent; no respiratory distress; CTAB Heart: regular rate and rhythm.  Skin: warm and dry Psychological: alert and cooperative; normal mood and affect  ASSESSMENT & PLAN:  1. Viral URI with cough   2. Flu-like symptoms     Meds ordered this encounter  Medications  . benzonatate (TESSALON) 100 MG capsule    Sig: Take 1 capsule (100 mg total) by mouth every 8 (eight) hours.    Dispense:  21 capsule    Refill:  0    Order Specific Question:   Supervising Provider    Answer:   Eustace Moore [[id number]]  . predniSONE (DELTASONE) 20 MG tablet    Sig: Take 1 tablet (20 mg total) by mouth 2 (two) times daily with a meal for 5 days.    Dispense:  10 tablet    Refill:  0    Order Specific Question:   Supervising Provider    Answer:   Eustace Moore [[id number]]    COVID testing ordered.  It will take between 5-7 days for test results.  Someone will contact you regarding abnormal results.    In the meantime: You should remain isolated in your home for 5 days from symptom onset AND greater than 72 hours after symptoms resolution (absence of fever without the use of fever-reducing medication and improvement in respiratory symptoms), whichever is longer Get plenty of rest and push fluids Tessalon Perles  prescribed for cough Use OTC zyrtec for nasal congestion, runny nose, and/or sore throat Use OTC flonase for nasal congestion and runny nose Use medications daily for symptom relief Use OTC medications like ibuprofen or tylenol as needed fever or pain Call or go to the ED if you have any new or worsening symptoms such as fever, worsening cough, shortness of breath, chest tightness, chest pain, turning blue, changes in mental status, etc...   Reviewed expectations re: course of current medical issues. Questions answered. Outlined signs and symptoms indicating need for more acute intervention. Patient verbalized understanding.  After Visit Summary given.         Alvino Chapel Harrah, PA-C 10/08/20 [phone]

## 2020-10-08 NOTE — ED Triage Notes (Signed)
Patient presents to Urgent Care with complaints of cough, chills, and diarrhea x 4 days ago.  Treating with allergy meds. Pt states she has a hx of severe bronchitis.  Pt states she had a rapid covid test yesterday result was negative.   Denies fever, abdominal pain.

## 2020-10-08 NOTE — Discharge Instructions (Signed)

## 2020-10-09 LAB — SARS-COV-2, NAA 2 DAY TAT

## 2020-10-09 LAB — NOVEL CORONAVIRUS, NAA: SARS-CoV-2, NAA: NOT DETECTED

## 2020-10-14 ENCOUNTER — Ambulatory Visit (INDEPENDENT_AMBULATORY_CARE_PROVIDER_SITE_OTHER): Payer: Medicaid Other | Admitting: *Deleted

## 2020-10-14 ENCOUNTER — Other Ambulatory Visit: Payer: Self-pay

## 2020-10-14 DIAGNOSIS — Z3042 Encounter for surveillance of injectable contraceptive: Secondary | ICD-10-CM

## 2020-10-14 MED ORDER — MEDROXYPROGESTERONE ACETATE 150 MG/ML IM SUSP
150.0000 mg | Freq: Once | INTRAMUSCULAR | Status: AC
  Administered 2020-10-14: 150 mg via INTRAMUSCULAR

## 2020-10-14 NOTE — Progress Notes (Signed)
   NURSE VISIT- INJECTION  SUBJECTIVE:  Suzanne Short is a 25 y.o. G25P1001 female here for a Depo Provera for contraception/period management. She is a GYN patient.   OBJECTIVE:  There were no vitals taken for this visit.  Appears well, in no apparent distress  Injection administered in: Left deltoid  Meds ordered this encounter  Medications  . medroxyPROGESTERone (DEPO-PROVERA) injection 150 mg    ASSESSMENT: GYN patient Depo Provera for contraception/period management PLAN: Follow-up: in 11-13 weeks for next Depo   Suzanne Short  10/14/2020 10:32 AM

## 2021-01-04 ENCOUNTER — Other Ambulatory Visit: Payer: Self-pay | Admitting: Adult Health

## 2021-01-05 ENCOUNTER — Ambulatory Visit (INDEPENDENT_AMBULATORY_CARE_PROVIDER_SITE_OTHER): Payer: Medicaid Other

## 2021-01-05 ENCOUNTER — Telehealth: Payer: Self-pay | Admitting: Adult Health

## 2021-01-05 ENCOUNTER — Ambulatory Visit: Payer: Medicaid Other

## 2021-01-05 ENCOUNTER — Other Ambulatory Visit: Payer: Self-pay

## 2021-01-05 DIAGNOSIS — Z3042 Encounter for surveillance of injectable contraceptive: Secondary | ICD-10-CM

## 2021-01-05 MED ORDER — MEDROXYPROGESTERONE ACETATE 150 MG/ML IM SUSY: PREFILLED_SYRINGE | Freq: Once | INTRAMUSCULAR | Status: AC

## 2021-01-05 NOTE — Progress Notes (Signed)
   NURSE VISIT- INJECTION  SUBJECTIVE:  Suzanne Short is a 25 y.o. G54P1001 female here for a Depo Provera for contraception/period management. She is a GYN patient.   OBJECTIVE:  There were no vitals taken for this visit.  Appears well, in no apparent distress  Injection administered in: Right deltoid  Meds ordered this encounter  Medications   medroxyPROGESTERone Acetate SUSY    ASSESSMENT: GYN patient Depo Provera for contraception/period management PLAN: Follow-up: in 11-13 weeks for next Depo   Suzanne Short  01/05/2021 4:05 PM

## 2021-01-05 NOTE — Telephone Encounter (Signed)
Called [facility], no answer.  Called [facility] to inquire about transferring med. Stated they had a great deal of issues doing that and requested a provider prescribe it. I offered to give a verbal order for a one time dose, so it could be filled and pt could make appt today. Order accepted.  Called pt's mother and updated her on prescription being filled at [facility] for a one time dose. Confirmed understanding.

## 2021-01-05 NOTE — Telephone Encounter (Signed)
Patient's mom called stating that Suzanne Short has a depo appointment this afternoon at 3:50 pm but her pharmacy states that they are out of stock of the depo. Patient's mom called the [facility] in [place] and they state they do have some in stock but they are needing a prescription sent over. Please send the prescription and please call mom to let her know it is has been sent.

## 2021-03-15 ENCOUNTER — Telehealth: Payer: Self-pay | Admitting: Adult Health

## 2021-03-15 NOTE — Telephone Encounter (Signed)
Pt has PAP/physical scheduled for 05/03/2021 Pt wants to know if she will still be able to get her depo shot on 03/31/2021  Please advise & notify pt

## 2021-03-15 NOTE — Telephone Encounter (Signed)
Returned pt's call to home number, mother answered and gave cell number to call.  Called pt's cell, no answer, left vm that she had refills on Depo and should not have any issues getting shot in Nov.

## 2021-03-30 ENCOUNTER — Other Ambulatory Visit: Payer: Self-pay | Admitting: Adult Health

## 2021-03-31 ENCOUNTER — Ambulatory Visit (INDEPENDENT_AMBULATORY_CARE_PROVIDER_SITE_OTHER): Payer: Medicaid Other | Admitting: *Deleted

## 2021-03-31 ENCOUNTER — Other Ambulatory Visit: Payer: Self-pay

## 2021-03-31 DIAGNOSIS — Z3042 Encounter for surveillance of injectable contraceptive: Secondary | ICD-10-CM

## 2021-03-31 MED ORDER — MEDROXYPROGESTERONE ACETATE 150 MG/ML IM SUSP
150.0000 mg | Freq: Once | INTRAMUSCULAR | Status: AC
  Administered 2021-03-31: 150 mg via INTRAMUSCULAR

## 2021-03-31 NOTE — Progress Notes (Signed)
   NURSE VISIT- INJECTION  SUBJECTIVE:  Suzanne Short is a 25 y.o. G37P1001 female here for a Depo Provera for contraception/period management. She is a GYN patient.   OBJECTIVE:  There were no vitals taken for this visit.  Appears well, in no apparent distress  Injection administered in: Left deltoid  Meds ordered this encounter  Medications   medroxyPROGESTERone (DEPO-PROVERA) injection 150 mg    ASSESSMENT: GYN patient Depo Provera for contraception/period management PLAN: Follow-up: in 11-13 weeks for next Depo   Jobe Marker  03/31/2021 4:02 PM

## 2021-05-03 ENCOUNTER — Other Ambulatory Visit: Payer: Self-pay

## 2021-05-03 ENCOUNTER — Other Ambulatory Visit (HOSPITAL_COMMUNITY)
Admission: RE | Admit: 2021-05-03 | Discharge: 2021-05-03 | Disposition: A | Discharge status: Home / Self Care | Payer: Medicaid Other | Source: Ambulatory Visit | Attending: Adult Health | Admitting: Adult Health

## 2021-05-03 ENCOUNTER — Encounter: Payer: Self-pay | Admitting: Adult Health

## 2021-05-03 ENCOUNTER — Ambulatory Visit (INDEPENDENT_AMBULATORY_CARE_PROVIDER_SITE_OTHER): Payer: Medicaid Other | Admitting: Adult Health

## 2021-05-03 VITALS — BP 121/76 | HR 76 | Ht 67.25 in | Wt 321.0 lb

## 2021-05-03 DIAGNOSIS — Z3042 Encounter for surveillance of injectable contraceptive: Secondary | ICD-10-CM

## 2021-05-03 DIAGNOSIS — R87610 Atypical squamous cells of undetermined significance on cytologic smear of cervix (ASC-US): Secondary | ICD-10-CM

## 2021-05-03 DIAGNOSIS — Z01419 Encounter for gynecological examination (general) (routine) without abnormal findings: Secondary | ICD-10-CM

## 2021-05-03 DIAGNOSIS — Z3009 Encounter for other general counseling and advice on contraception: Secondary | ICD-10-CM

## 2021-05-03 DIAGNOSIS — Z0000 Encounter for general adult medical examination without abnormal findings: Secondary | ICD-10-CM | POA: Diagnosis not present

## 2021-05-03 DIAGNOSIS — Z113 Encounter for screening for infections with a predominantly sexual mode of transmission: Secondary | ICD-10-CM | POA: Diagnosis not present

## 2021-05-03 NOTE — Progress Notes (Signed)
Patient ID: Suzanne Short, female   DOB: 08-May-1996, 25 y.o.   MRN: [id number] History of Present Illness: Suzanne Short is a 25 year old white female, with DP, G1P1001, in for well woman gyn exam and pap. Her pap last year was +ASCUS and HR HPV, she did not get colpo.   She has [employer].  Lab Results  Component Value Date   DIAGPAP (A) 03/13/2020    - Atypical squamous cells of undetermined significance (ASC-US)   HPV DETECTED (A) 02/22/2018   HPVHIGH Positive (A) 03/13/2020    No current PCP  Current Medications, Allergies, Past Medical History, Past Surgical History, Family History and Social History were reviewed in [employer] record.     Review of Systems: Patient denies any headaches, hearing loss, fatigue, blurred vision, shortness of breath, chest pain, abdominal pain, problems with bowel movements, urination, or intercourse. No joint pain or mood swings.     Physical Exam:BP 121/76 (BP Location: Left Arm, Patient Position: Sitting, Cuff Size: Large)    Pulse 76    Ht 5' 7.25" (1.708 m)    Wt (!) 321 lb (145.6 kg)    BMI 49.90 kg/m   General:  Well developed, well nourished, no acute distress Skin:  Warm and dry Neck:  Midline trachea, normal thyroid, good ROM, no lymphadenopathy Lungs; Clear to auscultation bilaterally Breast:  No dominant palpable mass, retraction, or nipple discharge Cardiovascular: Regular rate and rhythm Abdomen:  Soft, non tender, no hepatosplenomegaly Pelvic:  External genitalia is normal in appearance, has scarring from HS inner thighs.  The vagina is normal in appearance. Urethra has no lesions or masses. The cervix is bulbous. Pap with GC/CHL and HR HPV genotyping performed.  Uterus is felt to be normal size, shape, and contour.  No adnexal masses or tenderness noted.Bladder is non tender, no masses felt. Rectal: Deferred Extremities/musculoskeletal:  No swelling or varicosities noted, no clubbing or cyanosis Psych:  No  mood changes, alert and cooperative,seems happy AA is 1 Fall risk is low Depression screen [facility] 2/9 05/03/2021 03/13/2020 02/28/2019  Decreased Interest 1 1 0  Down, Depressed, Hopeless 0 1 0  PHQ - 2 Score 1 2 0  Altered sleeping 1 1 -  Tired, decreased energy 1 1 -  Change in appetite 0 0 -  Feeling bad or failure about yourself  1 1 -  Trouble concentrating 1 0 -  Moving slowly or fidgety/restless 0 0 -  Suicidal thoughts 0 0 -  PHQ-9 Score 5 5 -    GAD 7 : Generalized Anxiety Score 05/03/2021 03/13/2020  Nervous, Anxious, on Edge 0 1  Control/stop worrying 0 0  Worry too much - different things 0 1  Trouble relaxing 1 1  Restless 0 0  Easily annoyed or irritable 1 1  Afraid - awful might happen 0 0  Total GAD 7 Score 2 4      Upstream - 05/03/21 1538       Pregnancy Intention Screening   Does the patient want to become pregnant in the next year? No    Does the patient's partner want to become pregnant in the next year? No    Would the patient like to discuss contraceptive options today? No      Contraception Wrap Up   Current Method Hormonal Injection    End Method Hormonal Injection            Examination chaperoned by Malachy Mood LPN   Impression  and Plan: 1. Routine general medical examination at a health care facility Pap sent  2. Encounter for surveillance of injectable contraceptive Has refills on depo   3. Family planning Check HIV,RPR and hepatitis C antibody   4. Screen for STD (sexually transmitted disease) Check HIV,RPR and hepatitis C antibody   5. Atypical squamous cells of undetermined significance on cytologic smear of cervix (ASC-US) Pap sent   6. Encounter for gynecological examination with Papanicolaou smear of cervix Pap sent Physical in 1 year Pap in 3 if normal

## 2021-05-04 DIAGNOSIS — Z3009 Encounter for other general counseling and advice on contraception: Secondary | ICD-10-CM | POA: Diagnosis not present

## 2021-05-04 DIAGNOSIS — Z113 Encounter for screening for infections with a predominantly sexual mode of transmission: Secondary | ICD-10-CM | POA: Diagnosis not present

## 2021-05-05 LAB — RPR: RPR Ser Ql: NONREACTIVE

## 2021-05-05 LAB — HEPATITIS C ANTIBODY: Hep C Virus Ab: 0.1 s/co ratio (ref 0.0–0.9)

## 2021-05-05 LAB — HIV ANTIBODY (ROUTINE TESTING W REFLEX): HIV Screen 4th Generation wRfx: NONREACTIVE

## 2021-05-07 LAB — CYTOLOGY - PAP
Chlamydia: POSITIVE — AB
Comment: NEGATIVE
Comment: NEGATIVE
Comment: NEGATIVE
Comment: NORMAL
Diagnosis: NEGATIVE
HPV 16: NEGATIVE
HPV 18 / 45: NEGATIVE
High risk HPV: POSITIVE — AB
Neisseria Gonorrhea: NEGATIVE

## 2021-05-13 ENCOUNTER — Encounter: Payer: Self-pay | Admitting: Adult Health

## 2021-05-13 ENCOUNTER — Telehealth: Payer: Self-pay | Admitting: Adult Health

## 2021-05-13 DIAGNOSIS — A749 Chlamydial infection, unspecified: Secondary | ICD-10-CM | POA: Insufficient documentation

## 2021-05-13 HISTORY — DX: Chlamydial infection, unspecified: A74.9

## 2021-05-13 MED ORDER — DOXYCYCLINE HYCLATE 100 MG PO TABS: 100.0000 mg | ORAL_TABLET | Freq: Two times a day (BID) | ORAL | 0 refills | Status: DC

## 2021-05-13 NOTE — Telephone Encounter (Signed)
Unable to complete call, has +chlamydia and +HPV on pap, rx sent for doxycycline, no sex and partner needs to be treated. AND needs colpo appt. [facility] sent.

## 2021-05-28 ENCOUNTER — Other Ambulatory Visit: Payer: Self-pay | Admitting: Adult Health

## 2021-06-16 ENCOUNTER — Encounter: Payer: Medicaid Other | Admitting: Women's Health

## 2021-06-24 ENCOUNTER — Ambulatory Visit: Payer: Medicaid Other

## 2021-06-29 ENCOUNTER — Other Ambulatory Visit: Payer: Self-pay

## 2021-06-29 ENCOUNTER — Ambulatory Visit (INDEPENDENT_AMBULATORY_CARE_PROVIDER_SITE_OTHER): Payer: Medicaid Other | Admitting: *Deleted

## 2021-06-29 DIAGNOSIS — Z3042 Encounter for surveillance of injectable contraceptive: Secondary | ICD-10-CM | POA: Diagnosis not present

## 2021-06-29 MED ORDER — MEDROXYPROGESTERONE ACETATE 150 MG/ML IM SUSP
150.0000 mg | Freq: Once | INTRAMUSCULAR | Status: AC
  Administered 2021-06-29: 150 mg via INTRAMUSCULAR

## 2021-06-29 NOTE — Progress Notes (Signed)
° °  NURSE VISIT- INJECTION  SUBJECTIVE:  Suzanne Short is a 26 y.o. G41P1001 female here for a Depo Provera for contraception/period management. She is a GYN patient.   OBJECTIVE:  There were no vitals taken for this visit.  Appears well, in no apparent distress  Injection administered in: Right deltoid  Meds ordered this encounter  Medications   medroxyPROGESTERone (DEPO-PROVERA) injection 150 mg    ASSESSMENT: GYN patient Depo Provera for contraception/period management PLAN: Follow-up: in 11-13 weeks for next Depo   Jobe Marker  06/29/2021 2:33 PM

## 2021-07-09 ENCOUNTER — Encounter (HOSPITAL_COMMUNITY): Payer: Self-pay

## 2021-07-09 ENCOUNTER — Other Ambulatory Visit: Payer: Self-pay

## 2021-07-09 ENCOUNTER — Emergency Department (HOSPITAL_COMMUNITY)
Admission: EM | Admit: 2021-07-09 | Discharge: 2021-07-09 | Disposition: A | Discharge status: Home / Self Care | Payer: Medicaid Other | Attending: Emergency Medicine | Admitting: Emergency Medicine

## 2021-07-09 DIAGNOSIS — B349 Viral infection, unspecified: Secondary | ICD-10-CM | POA: Insufficient documentation

## 2021-07-09 DIAGNOSIS — Z20822 Contact with and (suspected) exposure to covid-19: Secondary | ICD-10-CM | POA: Insufficient documentation

## 2021-07-09 LAB — RESP PANEL BY RT-PCR (FLU A&B, COVID) ARPGX2
Influenza A by PCR: NEGATIVE
Influenza B by PCR: NEGATIVE
SARS Coronavirus 2 by RT PCR: NEGATIVE

## 2021-07-09 LAB — GROUP A STREP BY PCR: Group A Strep by PCR: NOT DETECTED

## 2021-07-09 MED ORDER — ONDANSETRON 4 MG PO TBDP: 4.0000 mg | ORAL_TABLET | Freq: Three times a day (TID) | ORAL | 0 refills | Status: DC | PRN

## 2021-07-09 MED ORDER — DEXAMETHASONE SODIUM PHOSPHATE 10 MG/ML IJ SOLN
8.0000 mg | Freq: Once | INTRAMUSCULAR | Status: AC
  Administered 2021-07-09: 8 mg via INTRAMUSCULAR
  Filled 2021-07-09: qty 1

## 2021-07-09 MED ORDER — ONDANSETRON 4 MG PO TBDP
4.0000 mg | ORAL_TABLET | Freq: Once | ORAL | Status: AC
  Administered 2021-07-09: 4 mg via ORAL
  Filled 2021-07-09: qty 1

## 2021-07-09 NOTE — ED Triage Notes (Signed)
Pov from home with cc of needing antibiotics for a sore throat. Her son was dx with strep today. Thinks she might have the flu too  Took excedrin 1130pm

## 2021-07-09 NOTE — ED Provider Notes (Signed)
[facility] EMERGENCY DEPARTMENT Provider Note   CSN: [id number] Arrival date & time: 07/09/21  2053     History  Chief Complaint  Patient presents with   Sore Throat    Suzanne Short is a 26 y.o. female presenting emergency department with sore throat, nausea vomiting.  Son has had similar symptoms and was diagnosed with strep today.  HPI     Home Medications Prior to Admission medications   Medication Sig Start Date End Date Taking? Authorizing Provider  ondansetron (ZOFRAN-ODT) 4 MG disintegrating tablet Take 1 tablet (4 mg total) by mouth every 8 (eight) hours as needed for up to 15 doses for nausea or vomiting. 07/09/21  Yes Lotoya Casella, Kermit Balo, MD  doxycycline (VIBRA-TABS) 100 MG tablet Take 1 tablet (100 mg total) by mouth 2 (two) times daily. 05/13/21   Adline Potter, NP  medroxyPROGESTERone Acetate 150 MG/ML SUSY INJECT 1 ML INTRAMUSCULARLY EVERY 90 DAYS 03/30/21   Adline Potter, NP      Allergies    Nsaids, Aleve [naproxen sodium], Amoxicillin, and Tylenol [acetaminophen]    Review of Systems   Review of Systems  Physical Exam Updated Vital Signs BP (!) 148/90 (BP Location: Right Arm)    Pulse 90    Temp (!) 97.5 F (36.4 C) (Oral)    Resp 19    Ht 5\' 9"  (1.753 m)    Wt 127 kg    SpO2 97%    BMI 41.35 kg/m  Physical Exam Constitutional:      General: She is not in acute distress. HENT:     Head: Normocephalic and atraumatic.     Comments: No peritonsillar abscess or tonsillar exudate Eyes:     Conjunctiva/sclera: Conjunctivae normal.     Pupils: Pupils are equal, round, and reactive to light.  Cardiovascular:     Rate and Rhythm: Normal rate and regular rhythm.  Pulmonary:     Effort: Pulmonary effort is normal. No respiratory distress.  Abdominal:     General: There is no distension.     Tenderness: There is no abdominal tenderness.  Skin:    General: Skin is warm and dry.  Neurological:     General: No focal deficit present.     Mental  Status: She is alert. Mental status is at baseline.  Psychiatric:        Mood and Affect: Mood normal.        Behavior: Behavior normal.    ED Results / Procedures / Treatments   Labs (all labs ordered are listed, but only abnormal results are displayed) Labs Reviewed  GROUP A STREP BY PCR  RESP PANEL BY RT-PCR (FLU A&B, COVID) [id number]    EKG None  Radiology No results found.  Procedures Procedures    Medications Ordered in ED Medications  dexamethasone (DECADRON) injection 8 mg (8 mg Intramuscular Given 07/09/21 2231)  ondansetron (ZOFRAN-ODT) disintegrating tablet 4 mg (4 mg Oral Given 07/09/21 2231)    ED Course/ Medical Decision Making/ A&P                           Medical Decision Making Risk Prescription drug management.   Patient is here with suspected viral syndrome, 6 out of the house.  He did not see signs of strep pharyngitis on exam and her strep test was negative.  COVID and flu are negative.  I suspect this is a virus with her multiple systems involved in GI  systems and nausea and vomiting.  She was given a shot of Decadron for her sore throat and Zofran for nausea, and I will prescribe this for home.  Okay for discharge        Final Clinical Impression(s) / ED Diagnoses Final diagnoses:  Viral syndrome    Rx / DC Orders ED Discharge Orders          Ordered    ondansetron (ZOFRAN-ODT) 4 MG disintegrating tablet  Every 8 hours PRN        07/09/21 2227              Terald Sleeper, MD 07/10/21 1018

## 2021-07-22 ENCOUNTER — Other Ambulatory Visit: Payer: Self-pay

## 2021-07-22 ENCOUNTER — Ambulatory Visit
Admission: EM | Admit: 2021-07-22 | Discharge: 2021-07-22 | Disposition: A | Discharge status: Home / Self Care | Payer: Self-pay | Attending: Urgent Care | Admitting: Urgent Care

## 2021-07-22 DIAGNOSIS — J9801 Acute bronchospasm: Secondary | ICD-10-CM

## 2021-07-22 DIAGNOSIS — F17200 Nicotine dependence, unspecified, uncomplicated: Secondary | ICD-10-CM

## 2021-07-22 DIAGNOSIS — J209 Acute bronchitis, unspecified: Secondary | ICD-10-CM

## 2021-07-22 DIAGNOSIS — R051 Acute cough: Secondary | ICD-10-CM

## 2021-07-22 MED ORDER — PSEUDOEPHEDRINE HCL 60 MG PO TABS: 60.0000 mg | ORAL_TABLET | Freq: Three times a day (TID) | ORAL | 0 refills | Status: DC | PRN

## 2021-07-22 MED ORDER — LEVOCETIRIZINE DIHYDROCHLORIDE 5 MG PO TABS: 5.0000 mg | ORAL_TABLET | Freq: Every evening | ORAL | 0 refills | Status: DC

## 2021-07-22 MED ORDER — PROMETHAZINE-DM 6.25-15 MG/5ML PO SYRP: 5.0000 mL | ORAL_SOLUTION | Freq: Four times a day (QID) | ORAL | 0 refills | Status: DC | PRN

## 2021-07-22 MED ORDER — ALBUTEROL SULFATE HFA 108 (90 BASE) MCG/ACT IN AERS: 1.0000 | INHALATION_SPRAY | Freq: Four times a day (QID) | RESPIRATORY_TRACT | 0 refills | Status: AC | PRN

## 2021-07-22 MED ORDER — PREDNISONE 50 MG PO TABS: 50.0000 mg | ORAL_TABLET | Freq: Every day | ORAL | 0 refills | Status: DC

## 2021-07-22 NOTE — ED Triage Notes (Signed)
Pt presents with recurring vomiting and diarrhea. States she has vomiting when she coughs. ?

## 2021-07-22 NOTE — ED Provider Notes (Signed)
?[facility] ? ? ?MRN: [id number] DOB: [birth year] ? ?Subjective:  ? ?Suzanne Short is a 26 y.o. female presenting for 1-2 week history recurrent cough with intermittent posttussive emesis.  Coughing can also cause intermittent diarrhea.  She is also had a runny and stuffy nose.  No fever, chest pain, shortness of breath.  She does have some intermittent wheezing.  She is working on quitting smoking.  In course of a year she has gone down from 2 packs/day to half pack per day.  He does also smoke marijuana.  No history of asthma or COPD.  She does have a regular doctor she can follow-up with.  She was seen at the hospital in February, responded very well to steroids and supportive care.  Until her symptoms recurred this past 1 to 2 weeks. ? ?No current facility-administered medications for this encounter. ? ?Current Outpatient Medications:  ?  medroxyPROGESTERone Acetate 150 MG/ML SUSY, INJECT 1 ML INTRAMUSCULARLY EVERY 90 DAYS, Disp: 1 mL, Rfl: 4 ?  ondansetron (ZOFRAN-ODT) 4 MG disintegrating tablet, Take 1 tablet (4 mg total) by mouth every 8 (eight) hours as needed for up to 15 doses for nausea or vomiting., Disp: 15 tablet, Rfl: 0  ? ?Allergies  ?Allergen Reactions  ? Nsaids Swelling  ?  Had angioedema and was admitted for 2 days at [facility]  ? Aleve [Naproxen Sodium] Hives  ? Amoxicillin Hives  ?  Able to take Keflex.  ? Tylenol [Acetaminophen] Hives  ? ? ?Past Medical History:  ?Diagnosis Date  ? Abnormal Pap smear of cervix 12/15/2016  ? LSIL, get colpo  ? Allergic reaction   ? recurrent  ? Bladder infection, chronic   ? Chlamydia 05/13/2021  ? 05/13/21 rx doxycycline,POC___________  ? Contraceptive management 07/14/2015  ? GERD (gastroesophageal reflux disease)   ? Hidradenitis   ? 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 Hypertension   ? Marijuana use   ? Renal disorder   ? chronic bladder infections  ? Supervision of normal pregnancy in first trimester 05/03/2013  ? Swelling   ? Vaginal Pap smear,  abnormal   ?  ? ?Past Surgical History:  ?Procedure Laterality Date  ? TONSILLECTOMY    ? ? ?Family History  ?Problem Relation Age of Onset  ? Diabetes Paternal Grandfather   ? Heart disease Paternal Grandfather   ? Diabetes Paternal Grandmother   ? Heart disease Paternal Grandmother   ? Diabetes Maternal Grandmother   ? Heart disease Maternal Grandmother   ? Diabetes Maternal Grandfather   ? Heart disease Maternal Grandfather   ? Heart disease Mother   ? Heart attack Father   ? ? ?Social History  ? ?Tobacco Use  ? Smoking status: Every Day  ?  Packs/day: 1.00  ?  Years: 7.00  ?  Pack years: 7.00  ?  Types: Cigarettes  ? Smokeless tobacco: Former  ?  Types: Chew  ?Vaping Use  ? Vaping Use: Never used  ?Substance Use Topics  ? Alcohol use: Yes  ?  Alcohol/week: 0.0 standard drinks  ?  Comment: occ  ? Drug use: No  ? ? ?ROS ? ? ?Objective:  ? ?Vitals: ?BP (!) 142/87   Pulse 94   Temp 98.4 ?F (36.9 ?C)   Resp 18   SpO2 95%  ? ?Physical Exam ?Constitutional:   ?   General: She is not in acute distress. ?   Appearance: Normal appearance. She is well-developed. She is obese. She is not ill-appearing, toxic-appearing or diaphoretic.  ?  HENT:  ?   Head: Normocephalic and atraumatic.  ?   Right Ear: Tympanic membrane, ear canal and external ear normal.  ?   Left Ear: Tympanic membrane, ear canal and external ear normal.  ?   Nose: Nose normal.  ?   Mouth/Throat:  ?   Mouth: Mucous membranes are moist.  ?   Pharynx: No oropharyngeal exudate or posterior oropharyngeal erythema.  ?   Comments: Mild postnasal drainage overlying pharynx. ?Eyes:  ?   General: No scleral icterus.    ?   Right eye: No discharge.     ?   Left eye: No discharge.  ?   Extraocular Movements: Extraocular movements intact.  ?   Conjunctiva/sclera: Conjunctivae normal.  ?Cardiovascular:  ?   Rate and Rhythm: Normal rate.  ?   Heart sounds: No murmur heard. ?  No friction rub. No gallop.  ?Pulmonary:  ?   Effort: Pulmonary effort is normal. No  respiratory distress.  ?   Breath sounds: No stridor. Rhonchi (mild mid to lower lung fields) present. No wheezing or rales.  ?Chest:  ?   Chest wall: No tenderness.  ?Abdominal:  ?   General: Bowel sounds are normal. There is no distension.  ?   Palpations: Abdomen is soft. There is no mass.  ?   Tenderness: There is no abdominal tenderness. There is no right CVA tenderness, left CVA tenderness, guarding or rebound.  ?Skin: ?   General: Skin is warm and dry.  ?Neurological:  ?   General: No focal deficit present.  ?   Mental Status: She is alert and oriented to person, place, and time.  ?Psychiatric:     ?   Mood and Affect: Mood normal.     ?   Behavior: Behavior normal.     ?   Thought Content: Thought content normal.     ?   Judgment: Judgment normal.  ? ? ?Assessment and Plan :  ? ?PDMP not reviewed this encounter. ? ?1. Acute bronchitis, unspecified organism   ?2. Smoker   ?3. Bronchospasm   ?4. Acute cough   ? ?Recommended an oral prednisone course in the context of her lung exam, smoking.  High suspicion for having reactive airway versus COPD given her smoking history.  Recommended follow-up with her PCP for consideration of an inhaled steroid.  I provided her with her own prescription of albuterol inhaler as she was using her sons.  Recommended starting Xyzal daily.  As she has no signs of pneumonia on exam, deferred imaging.  Given timeline of illness also deferred respiratory testing.  Vital signs hemodynamically stable for outpatient management.  Counseled patient on potential for adverse effects with medications prescribed/recommended today, ER and return-to-clinic precautions discussed, patient verbalized understanding. ? ?  ?Wallis Bamberg, PA-C ?07/22/21 3244 ? ?

## 2021-09-21 ENCOUNTER — Ambulatory Visit (INDEPENDENT_AMBULATORY_CARE_PROVIDER_SITE_OTHER): Payer: Medicaid Other | Admitting: *Deleted

## 2021-09-21 DIAGNOSIS — Z3042 Encounter for surveillance of injectable contraceptive: Secondary | ICD-10-CM | POA: Diagnosis not present

## 2021-09-21 MED ORDER — MEDROXYPROGESTERONE ACETATE 150 MG/ML IM SUSP
150.0000 mg | Freq: Once | INTRAMUSCULAR | Status: AC
  Administered 2021-09-21: 150 mg via INTRAMUSCULAR

## 2021-09-21 NOTE — Progress Notes (Signed)
? ?  NURSE VISIT- INJECTION ? ?SUBJECTIVE:  ?Suzanne Short is a 26 y.o. G51P1001 female here for a Depo Provera for contraception/period management. She is a GYN patient.  ? ?OBJECTIVE:  ?There were no vitals taken for this visit.  ?Appears well, in no apparent distress ? ?Injection administered in: Left deltoid ? ?Meds ordered this encounter  ?Medications  ? medroxyPROGESTERone (DEPO-PROVERA) injection 150 mg  ? ? ?ASSESSMENT: ?GYN patient Depo Provera for contraception/period management ?PLAN: ?Follow-up: in 11-13 weeks for next Depo  ? ?Malachy Mood  ?09/21/2021 ?4:32 PM ? ?

## 2021-12-14 ENCOUNTER — Ambulatory Visit: Payer: Managed Care, Other (non HMO)

## 2021-12-21 ENCOUNTER — Ambulatory Visit: Payer: Managed Care, Other (non HMO)

## 2021-12-21 DIAGNOSIS — Z3042 Encounter for surveillance of injectable contraceptive: Secondary | ICD-10-CM | POA: Diagnosis not present

## 2021-12-21 MED ORDER — MEDROXYPROGESTERONE ACETATE 150 MG/ML IM SUSP
150.0000 mg | Freq: Once | INTRAMUSCULAR | Status: AC
  Administered 2021-12-21: 150 mg via INTRAMUSCULAR

## 2021-12-21 NOTE — Progress Notes (Signed)
   NURSE VISIT- INJECTION  SUBJECTIVE:  Suzanne Short is a 26 y.o. G33P1001 female here for a Depo Provera for contraception/period management. She is a GYN patient.   OBJECTIVE:  There were no vitals taken for this visit.  Appears well, in no apparent distress  Injection administered in: Right deltoid  Meds ordered this encounter  Medications   medroxyPROGESTERone (DEPO-PROVERA) injection 150 mg    ASSESSMENT: GYN patient Depo Provera for contraception/period management PLAN: Follow-up: in 11-13 weeks for next Depo   Malachy Mood  12/21/2021 4:07 PM

## 2022-03-15 ENCOUNTER — Ambulatory Visit: Payer: Managed Care, Other (non HMO)

## 2023-10-04 ENCOUNTER — Emergency Department (HOSPITAL_COMMUNITY)
Admission: EM | Admit: 2023-10-04 | Discharge: 2023-10-04 | Disposition: A | Discharge status: Home / Self Care | Attending: Emergency Medicine | Admitting: Emergency Medicine

## 2023-10-04 ENCOUNTER — Emergency Department (HOSPITAL_COMMUNITY)

## 2023-10-04 ENCOUNTER — Other Ambulatory Visit: Payer: Self-pay

## 2023-10-04 ENCOUNTER — Encounter (HOSPITAL_COMMUNITY): Payer: Self-pay

## 2023-10-04 DIAGNOSIS — S99921A Unspecified injury of right foot, initial encounter: Secondary | ICD-10-CM | POA: Diagnosis present

## 2023-10-04 DIAGNOSIS — S92411A Displaced fracture of proximal phalanx of right great toe, initial encounter for closed fracture: Secondary | ICD-10-CM | POA: Insufficient documentation

## 2023-10-04 DIAGNOSIS — W228XXA Striking against or struck by other objects, initial encounter: Secondary | ICD-10-CM | POA: Diagnosis not present

## 2023-10-04 MED ORDER — OXYCODONE HCL 5 MG PO TABS: 5.0000 mg | ORAL_TABLET | Freq: Four times a day (QID) | ORAL | 0 refills | Status: AC | PRN

## 2023-10-04 NOTE — Discharge Instructions (Signed)
 Your toe looks broken, please use the crutches, try not to bear any weight on that foot.  If you need to you can put pressure on your heel.  RICE therapy:  Apply ice wrapped in a towel intermittently keeping it on the skin no longer than 10 minutes a couple of times an hour  Elevate the affected extremity to help reduce blood flow and prevent swelling  Use an anti-inflammatory if you are not allergic to it such as ibuprofen  or Naprosyn to help with pain and swelling  Use a compressive device whether it is an Ace wrap or other  immobilizer to help minimize movement and compress the swelling.  Opiate medications such as Percocet or Vicodin or morphine are very strong pain medications that are also very addictive if they are taken for too long, even a single dose can predispose someone to become addicted so be very careful when taking this medication.  You should take the smallest amount possible to relieve your pain, these medications may cause constipation or nausea, please follow-up with your doctor if you are having the need for ongoing pain control despite these medications.  Additionally please be aware that these medications may cause sedation or sleepiness or alter judgment so you should not take this if you are driving a vehicle or operating heavy machinery or taking care of small children.  See the phone number for Dr. Ernesta Heading attached, call today to make the next follow-up appointment for follow-up, this may need surgery

## 2023-10-04 NOTE — ED Provider Notes (Signed)
 Suzanne Short Provider Note   CSN: [id number] Arrival date & time: 10/04/23  1309     History  Chief Complaint  Patient presents with   Toe Injury    [name] is a 28 y.o. female.  HPI   Patient is a 28 year old female who states that she stubbed her toe on her dresser last night, started having swelling bruising and pain this morning, used a postop shoe, could not get her steel toe boots on for work without pain so she comes here for evaluation.  Took some ibuprofen  this morning  Home Medications Prior to Admission medications   Medication Sig Start Date End Date Taking? Authorizing Provider  oxyCODONE  (ROXICODONE ) 5 MG immediate release tablet Take 1 tablet (5 mg total) by mouth every 6 (six) hours as needed for severe pain (pain score 7-10). 10/04/23  Yes Early Glisson, MD  albuterol  (VENTOLIN  HFA) 108 (90 Base) MCG/ACT inhaler Inhale 1-2 puffs into the lungs every 6 (six) hours as needed for wheezing or shortness of breath. 07/22/21   Adolph Hoop, PA-C  medroxyPROGESTERone  Acetate 150 MG/ML SUSY INJECT 1 ML INTRAMUSCULARLY EVERY 90 DAYS 03/30/21   Javan Messing, NP      Allergies    Nsaids, Aleve [naproxen sodium], Amoxicillin, and Tylenol [acetaminophen]    Review of Systems   Review of Systems  All other systems reviewed and are negative.   Physical Exam Updated Vital Signs BP (!) 147/90 (BP Location: Right Arm)   Pulse 72   Temp 98.2 F (36.8 C) (Oral)   Resp 16   Ht 1.702 m (5\' 7" )   Wt 113.4 kg   LMP 09/09/2023   SpO2 99%   BMI 39.16 kg/m  Physical Exam Vitals and nursing note reviewed.  Constitutional:      Appearance: She is well-developed. She is not diaphoretic.  HENT:     Head: Normocephalic and atraumatic.  Eyes:     General:        Right eye: No discharge.        Left eye: No discharge.     Conjunctiva/sclera: Conjunctivae normal.  Pulmonary:     Effort: Pulmonary effort is normal. No  respiratory distress.  Musculoskeletal:     Comments: Isolated tenderness and bruising over the right great toe middle phalanx, tenderness and swelling present, normal cap refill distally, no open wounds  Skin:    General: Skin is warm and dry.     Findings: No erythema or rash.  Neurological:     Mental Status: She is alert.     Coordination: Coordination normal.     ED Results / Procedures / Treatments   Labs (all labs ordered are listed, but only abnormal results are displayed) Labs Reviewed - No data to display  EKG None  Radiology DG Foot Complete Right Result Date: 10/04/2023 CLINICAL DATA:  Trauma to the right fifth toe. EXAM: RIGHT FOOT COMPLETE - 3+ VIEW COMPARISON:  None Available. FINDINGS: There is a nondisplaced fracture of the proximal phalanx of the great toe. Sclerotic density over the proximal fourth metatarsal, likely chronic. An acute fracture is less likely. Correlation with point tenderness over the proximal fourth metatarsal recommended. No dislocation. The bones are well mineralized. No arthritic changes. Mild soft tissue swelling of the great toe. No radiopaque foreign object or soft tissue gas. IMPRESSION: 1. Nondisplaced fracture of the proximal phalanx of the great toe. 2. Sclerotic density over the proximal fourth metatarsal, likely  chronic. Electronically Signed   By: Angus Bark M.D.   On: 10/04/2023 15:23    Procedures Procedures    Medications Ordered in ED Medications - No data to display  ED Course/ Medical Decision Making/ A&P                                 Medical Decision Making Amount and/or Complexity of Data Reviewed Radiology: ordered.  Risk Prescription drug management.   Local toe injury, fracture versus contusion  Imaging: I personally viewed and interpreted the x-ray of the right foot which shows that the patient likely has a comminuted fracture of the proximal phalanx of the right foot, agree with radiologist  rotation  Recommended the patient have rice therapy, out of work, see orthopedics, short amount of opiates given but cautioned against using them if she can, patient agreeable, no other injuries  Closed fracture, follow-up, nonweightbearing, crutches offered        Final Clinical Impression(s) / ED Diagnoses Final diagnoses:  Closed displaced fracture of proximal phalanx of right great toe, initial encounter    Rx / DC Orders ED Discharge Orders          Ordered    oxyCODONE  (ROXICODONE ) 5 MG immediate release tablet  Every 6 hours PRN        10/04/23 1434              Early Glisson, MD 10/04/23 1533

## 2023-10-04 NOTE — ED Triage Notes (Signed)
 Pt arrives in post opt shoe that she had at home after hitting right big toe on dresser last night. Toe is bruise. Pt reports taking 600 mg ibuprofen  around 6 am.

## 2023-10-10 ENCOUNTER — Ambulatory Visit (INDEPENDENT_AMBULATORY_CARE_PROVIDER_SITE_OTHER): Admitting: Orthopedic Surgery

## 2023-10-10 ENCOUNTER — Encounter: Payer: Self-pay | Admitting: Orthopedic Surgery

## 2023-10-10 DIAGNOSIS — S92491A Other fracture of right great toe, initial encounter for closed fracture: Secondary | ICD-10-CM

## 2023-10-10 NOTE — Progress Notes (Signed)
 New Patient Visit  Assessment: Suzanne Short is a 28 y.o. female with the following: 1. Other fracture of right great toe, initial encounter for closed fracture  Plan: Suzanne Short stubbed her toe, after inadvertently kicking a Child psychotherapist.  She has a comminuted fracture of the proximal phalanx of the right great toe.  Minimal displacement.  The swelling and bruising has significantly improved.  Continue with the postop shoe as needed.  She would like to return to work, and I think this is reasonable.  Discussed the possibility of buddy taping her toes.  She can bear weight as tolerated.  Otherwise, would recommend using the postop shoe.  If she has any further issues, she will contact clinic.  Elevate the foot to help with swelling.  Medications as needed.  Follow-up: Return if symptoms worsen or fail to improve.  Subjective:  Chief Complaint  Patient presents with   Fracture    R big toe DOI 10/03/23    History of Present Illness: Suzanne Short is a 28 y.o. female who presents for evaluation of right great toe pain.  About a week ago, she was startled by her son, and she instinctively, but inadvertently kicked her dresser.  She had immediate pain.  She was evaluated the emergency department.  She was noted to have a fracture of the right great toe.  At first, she notes a lot of swelling and color changing of the foot.  This is improved.  She has been wearing a postop shoe.  She works at [employer].  She uses a forklift.  She has to be able to wear steel toe boots in order to return to work.   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   Hypertension    Marijuana use    Renal disorder    chronic bladder infections   Supervision of normal pregnancy in first trimester 05/03/2013   Swelling    Vaginal Pap smear, abnormal     Past Surgical History:  Procedure Laterality Date   TONSILLECTOMY      Family History  Problem Relation Age of Onset   Diabetes Paternal Grandfather    Heart disease Paternal Grandfather    Diabetes Paternal Grandmother    Heart disease Paternal Grandmother    Diabetes Maternal Grandmother    Heart disease Maternal Grandmother    Diabetes Maternal Grandfather    Heart disease Maternal Grandfather    Heart disease Mother    Heart attack Father    Social History   Tobacco Use   Smoking status: Every Day    Current packs/day: 1.00    Average packs/day: 1 pack/day for 7.0 years (7.0 ttl pk-yrs)    Types: Cigarettes   Smokeless tobacco: Former    Types: Engineer, drilling   Vaping status: Some Days  Substance Use Topics   Alcohol use: Yes    Alcohol/week: 0.0 standard drinks of alcohol    Comment: occ   Drug use: Yes  Types: Marijuana    Allergies  Allergen Reactions   Nsaids Swelling    Had angioedema and was admitted for 2 days at [facility]   Aleve [Naproxen Sodium] Hives   Amoxicillin Hives    Able to take Keflex.   Tylenol [Acetaminophen] Hives    Current Meds  Medication Sig   albuterol  (VENTOLIN  HFA) 108 (90 Base) MCG/ACT inhaler Inhale 1-2 puffs into the lungs every 6 (six) hours as needed for wheezing or shortness of breath.   medroxyPROGESTERone  Acetate 150 MG/ML SUSY INJECT 1 ML INTRAMUSCULARLY EVERY 90 DAYS   oxyCODONE  (ROXICODONE ) 5 MG immediate release tablet Take 1 tablet (5 mg total) by mouth every 6 (six) hours as needed for severe pain (pain score 7-10).    Objective: LMP 09/09/2023   Physical  Exam:  General: Alert and oriented. and No acute distress. Gait: Right sided antalgic gait.  Evaluation of the right foot demonstrates mild swelling to the great toe.  There is some ecchymosis at the base of the toe.  Tenderness to palpation at the MTP, as well as the proximal phalanx.  Limited motion at the IP joint of the great toe.  Toes warm and well-perfused.  IMAGING: I personally reviewed images previously obtained from the ED  X-rays from the emergency department were available in clinic today.  Comminuted fracture of the proximal phalanx of the right great toe.  Minimal displacement.  No obvious intra-articular involvement.   New Medications:  No orders of the defined types were placed in this encounter.     Tonita Frater, MD  10/10/2023 3:52 PM

## 2024-06-28 ENCOUNTER — Ambulatory Visit: Admitting: Podiatry

## 2024-06-28 ENCOUNTER — Ambulatory Visit: Payer: Self-pay | Admitting: Family Medicine

## 2024-11-20 ENCOUNTER — Ambulatory Visit: Payer: Self-pay
